# Patient Record
Sex: Female | Born: 1980 | Race: Black or African American | Hispanic: No | Smoking: Never smoker
Health system: Southern US, Community
[De-identification: ages and names within clinical notes are randomized; demographics above are authoritative.]

## PROBLEM LIST (undated history)

## (undated) ENCOUNTER — Inpatient Hospital Stay (HOSPITAL_COMMUNITY): Payer: Self-pay

## (undated) DIAGNOSIS — F419 Anxiety disorder, unspecified: Secondary | ICD-10-CM

## (undated) DIAGNOSIS — D649 Anemia, unspecified: Secondary | ICD-10-CM

## (undated) DIAGNOSIS — N390 Urinary tract infection, site not specified: Secondary | ICD-10-CM

## (undated) DIAGNOSIS — N83209 Unspecified ovarian cyst, unspecified side: Secondary | ICD-10-CM

## (undated) DIAGNOSIS — A549 Gonococcal infection, unspecified: Secondary | ICD-10-CM

## (undated) DIAGNOSIS — IMO0002 Reserved for concepts with insufficient information to code with codable children: Secondary | ICD-10-CM

## (undated) DIAGNOSIS — R87619 Unspecified abnormal cytological findings in specimens from cervix uteri: Secondary | ICD-10-CM

## (undated) HISTORY — PX: OTHER SURGICAL HISTORY: SHX169

## (undated) HISTORY — PX: LEEP: SHX91

## (undated) HISTORY — PX: WISDOM TOOTH EXTRACTION: SHX21

## (undated) HISTORY — DX: Anxiety disorder, unspecified: F41.9

## (undated) HISTORY — PX: CHOLECYSTECTOMY: SHX55

## (undated) HISTORY — PX: DILATION AND CURETTAGE OF UTERUS: SHX78

---

## 2002-01-16 ENCOUNTER — Encounter: Payer: Self-pay | Admitting: Emergency Medicine

## 2002-01-16 ENCOUNTER — Emergency Department (HOSPITAL_COMMUNITY): Admission: EM | Admit: 2002-01-16 | Discharge: 2002-01-17 | Payer: Self-pay | Admitting: Emergency Medicine

## 2004-03-12 ENCOUNTER — Emergency Department (HOSPITAL_COMMUNITY): Admission: EM | Admit: 2004-03-12 | Discharge: 2004-03-13 | Payer: Self-pay | Admitting: *Deleted

## 2004-03-20 ENCOUNTER — Emergency Department (HOSPITAL_COMMUNITY): Admission: EM | Admit: 2004-03-20 | Discharge: 2004-03-20 | Payer: Self-pay | Admitting: Emergency Medicine

## 2004-09-29 ENCOUNTER — Emergency Department (HOSPITAL_COMMUNITY): Admission: EM | Admit: 2004-09-29 | Discharge: 2004-09-30 | Payer: Self-pay | Admitting: *Deleted

## 2004-12-04 ENCOUNTER — Emergency Department (HOSPITAL_COMMUNITY): Admission: EM | Admit: 2004-12-04 | Discharge: 2004-12-04 | Payer: Self-pay | Admitting: Emergency Medicine

## 2005-06-01 ENCOUNTER — Emergency Department (HOSPITAL_COMMUNITY): Admission: EM | Admit: 2005-06-01 | Discharge: 2005-06-01 | Payer: Self-pay | Admitting: Emergency Medicine

## 2005-06-02 ENCOUNTER — Encounter: Admission: RE | Admit: 2005-06-02 | Discharge: 2005-06-02 | Payer: Self-pay | Admitting: *Deleted

## 2005-06-23 ENCOUNTER — Encounter: Admission: RE | Admit: 2005-06-23 | Discharge: 2005-06-23 | Payer: Self-pay | Admitting: *Deleted

## 2005-10-01 ENCOUNTER — Emergency Department (HOSPITAL_COMMUNITY): Admission: EM | Admit: 2005-10-01 | Discharge: 2005-10-01 | Payer: Self-pay | Admitting: Emergency Medicine

## 2005-10-05 DIAGNOSIS — A549 Gonococcal infection, unspecified: Secondary | ICD-10-CM

## 2005-10-05 HISTORY — DX: Gonococcal infection, unspecified: A54.9

## 2006-03-03 ENCOUNTER — Emergency Department (HOSPITAL_COMMUNITY): Admission: EM | Admit: 2006-03-03 | Discharge: 2006-03-03 | Payer: Self-pay | Admitting: Emergency Medicine

## 2006-06-08 ENCOUNTER — Emergency Department (HOSPITAL_COMMUNITY): Admission: EM | Admit: 2006-06-08 | Discharge: 2006-06-08 | Payer: Self-pay | Admitting: Emergency Medicine

## 2006-10-14 ENCOUNTER — Emergency Department (HOSPITAL_COMMUNITY): Admission: EM | Admit: 2006-10-14 | Discharge: 2006-10-14 | Payer: Self-pay | Admitting: Emergency Medicine

## 2007-05-08 ENCOUNTER — Emergency Department (HOSPITAL_COMMUNITY): Admission: EM | Admit: 2007-05-08 | Discharge: 2007-05-08 | Payer: Self-pay | Admitting: Emergency Medicine

## 2007-07-16 ENCOUNTER — Emergency Department (HOSPITAL_COMMUNITY): Admission: EM | Admit: 2007-07-16 | Discharge: 2007-07-16 | Payer: Self-pay | Admitting: Emergency Medicine

## 2008-02-27 ENCOUNTER — Inpatient Hospital Stay (HOSPITAL_COMMUNITY): Admission: AD | Admit: 2008-02-27 | Discharge: 2008-02-27 | Payer: Self-pay | Admitting: Obstetrics & Gynecology

## 2008-03-01 ENCOUNTER — Inpatient Hospital Stay (HOSPITAL_COMMUNITY): Admission: AD | Admit: 2008-03-01 | Discharge: 2008-03-01 | Payer: Self-pay | Admitting: Obstetrics & Gynecology

## 2008-03-08 ENCOUNTER — Inpatient Hospital Stay (HOSPITAL_COMMUNITY): Admission: RE | Admit: 2008-03-08 | Discharge: 2008-03-08 | Payer: Self-pay | Admitting: Obstetrics & Gynecology

## 2008-08-15 ENCOUNTER — Emergency Department (HOSPITAL_COMMUNITY): Admission: EM | Admit: 2008-08-15 | Discharge: 2008-08-15 | Payer: Self-pay | Admitting: Emergency Medicine

## 2008-08-31 ENCOUNTER — Emergency Department (HOSPITAL_COMMUNITY): Admission: EM | Admit: 2008-08-31 | Discharge: 2008-08-31 | Payer: Self-pay | Admitting: Emergency Medicine

## 2008-12-24 ENCOUNTER — Emergency Department (HOSPITAL_COMMUNITY): Admission: EM | Admit: 2008-12-24 | Discharge: 2008-12-24 | Payer: Self-pay | Admitting: Emergency Medicine

## 2009-01-05 ENCOUNTER — Emergency Department (HOSPITAL_COMMUNITY): Admission: EM | Admit: 2009-01-05 | Discharge: 2009-01-05 | Payer: Self-pay | Admitting: Emergency Medicine

## 2009-01-30 ENCOUNTER — Inpatient Hospital Stay (HOSPITAL_COMMUNITY): Admission: AD | Admit: 2009-01-30 | Discharge: 2009-01-30 | Payer: Self-pay | Admitting: Obstetrics & Gynecology

## 2009-05-02 ENCOUNTER — Inpatient Hospital Stay (HOSPITAL_COMMUNITY): Admission: AD | Admit: 2009-05-02 | Discharge: 2009-05-02 | Payer: Self-pay | Admitting: Obstetrics and Gynecology

## 2009-05-17 ENCOUNTER — Inpatient Hospital Stay (HOSPITAL_COMMUNITY): Admission: AD | Admit: 2009-05-17 | Discharge: 2009-05-17 | Payer: Self-pay | Admitting: Obstetrics and Gynecology

## 2009-05-18 ENCOUNTER — Ambulatory Visit: Payer: Self-pay | Admitting: Physician Assistant

## 2009-05-18 ENCOUNTER — Inpatient Hospital Stay (HOSPITAL_COMMUNITY): Admission: AD | Admit: 2009-05-18 | Discharge: 2009-05-18 | Payer: Self-pay | Admitting: Obstetrics and Gynecology

## 2009-05-29 ENCOUNTER — Ambulatory Visit (HOSPITAL_COMMUNITY): Admission: RE | Admit: 2009-05-29 | Discharge: 2009-05-29 | Payer: Self-pay | Admitting: Obstetrics and Gynecology

## 2009-08-02 ENCOUNTER — Ambulatory Visit: Payer: Self-pay | Admitting: Family Medicine

## 2009-08-02 ENCOUNTER — Inpatient Hospital Stay (HOSPITAL_COMMUNITY): Admission: AD | Admit: 2009-08-02 | Discharge: 2009-08-02 | Payer: Self-pay | Admitting: Obstetrics and Gynecology

## 2009-08-18 ENCOUNTER — Inpatient Hospital Stay (HOSPITAL_COMMUNITY): Admission: AD | Admit: 2009-08-18 | Discharge: 2009-08-18 | Payer: Self-pay | Admitting: Obstetrics and Gynecology

## 2009-08-20 ENCOUNTER — Ambulatory Visit (HOSPITAL_COMMUNITY): Admission: RE | Admit: 2009-08-20 | Discharge: 2009-08-20 | Payer: Self-pay | Admitting: Obstetrics and Gynecology

## 2009-08-23 ENCOUNTER — Ambulatory Visit (HOSPITAL_COMMUNITY): Admission: RE | Admit: 2009-08-23 | Discharge: 2009-08-23 | Payer: Self-pay | Admitting: Obstetrics and Gynecology

## 2009-08-26 ENCOUNTER — Inpatient Hospital Stay (HOSPITAL_COMMUNITY): Admission: AD | Admit: 2009-08-26 | Discharge: 2009-08-26 | Payer: Self-pay | Admitting: Obstetrics and Gynecology

## 2009-08-26 ENCOUNTER — Encounter: Payer: Self-pay | Admitting: Obstetrics and Gynecology

## 2009-09-02 ENCOUNTER — Ambulatory Visit (HOSPITAL_COMMUNITY): Admission: RE | Admit: 2009-09-02 | Discharge: 2009-09-02 | Payer: Self-pay | Admitting: Obstetrics and Gynecology

## 2009-09-04 ENCOUNTER — Ambulatory Visit (HOSPITAL_COMMUNITY): Admission: RE | Admit: 2009-09-04 | Discharge: 2009-09-04 | Payer: Self-pay | Admitting: Obstetrics and Gynecology

## 2009-09-05 ENCOUNTER — Inpatient Hospital Stay (HOSPITAL_COMMUNITY): Admission: RE | Admit: 2009-09-05 | Discharge: 2009-09-08 | Payer: Self-pay | Admitting: Obstetrics and Gynecology

## 2009-09-05 ENCOUNTER — Encounter (INDEPENDENT_AMBULATORY_CARE_PROVIDER_SITE_OTHER): Payer: Self-pay | Admitting: Obstetrics and Gynecology

## 2009-11-25 ENCOUNTER — Emergency Department (HOSPITAL_COMMUNITY): Admission: EM | Admit: 2009-11-25 | Discharge: 2009-11-25 | Payer: Self-pay | Admitting: Emergency Medicine

## 2010-04-29 ENCOUNTER — Emergency Department (HOSPITAL_COMMUNITY): Admission: EM | Admit: 2010-04-29 | Discharge: 2010-04-29 | Payer: Self-pay | Admitting: Emergency Medicine

## 2010-06-17 DIAGNOSIS — E785 Hyperlipidemia, unspecified: Secondary | ICD-10-CM | POA: Insufficient documentation

## 2010-06-17 DIAGNOSIS — J45909 Unspecified asthma, uncomplicated: Secondary | ICD-10-CM | POA: Insufficient documentation

## 2010-10-05 DIAGNOSIS — F419 Anxiety disorder, unspecified: Secondary | ICD-10-CM

## 2010-10-05 HISTORY — DX: Anxiety disorder, unspecified: F41.9

## 2010-10-24 IMAGING — US US OB DETAIL EACH ADDL GEST + 14 WK
1 series · 14 of 28 positions shown · non-contrast
Comparison: none

OBSTETRICAL ULTRASOUND:
 This ultrasound exam was performed in the [HOSPITAL] Ultrasound Department.  The OB US report was generated in the AS system, and faxed to the ordering physician.  This report is also available in [REDACTED] PACS.

[Series 1: us ob detail add'left gest + 14 wk · 0.25mm/px · 93 acquisitions, 14 frames shown]
[im 4/93]
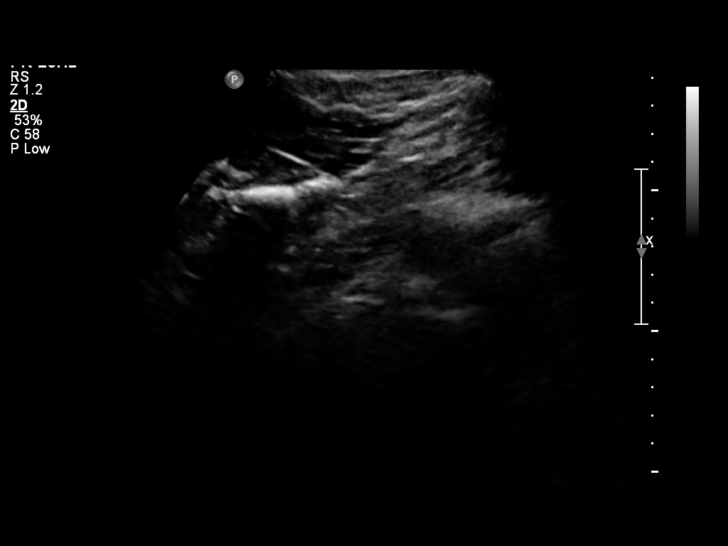
[im 11/93]
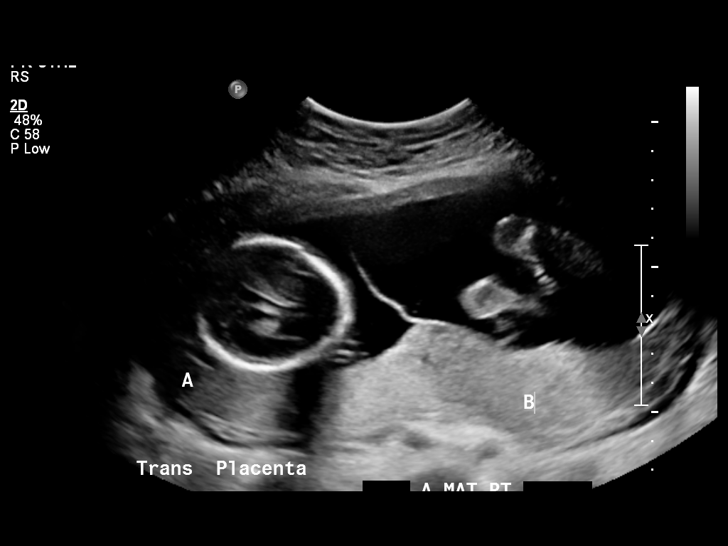
[im 18/93]
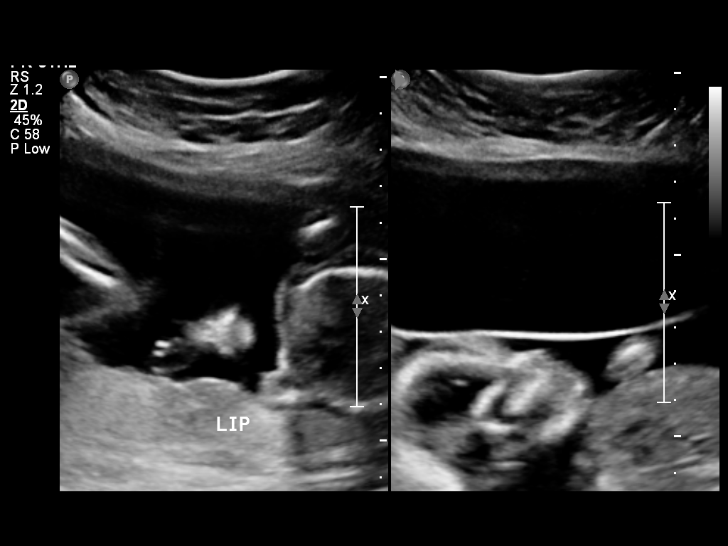
[im 24/93]
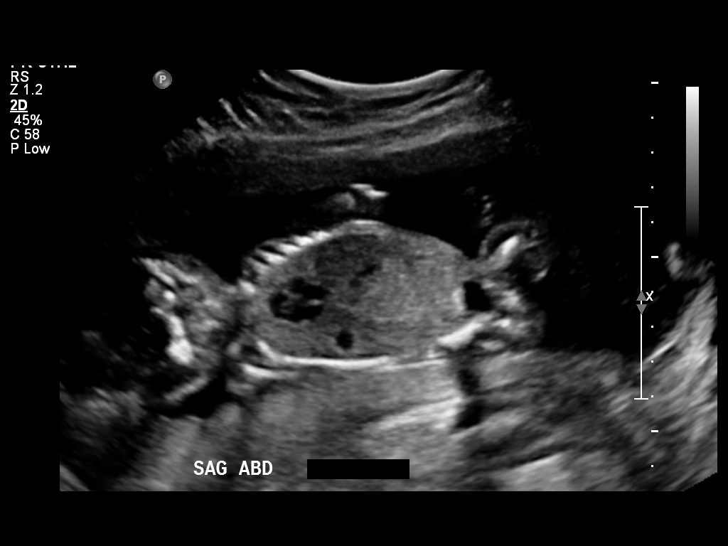
[im 31/93]
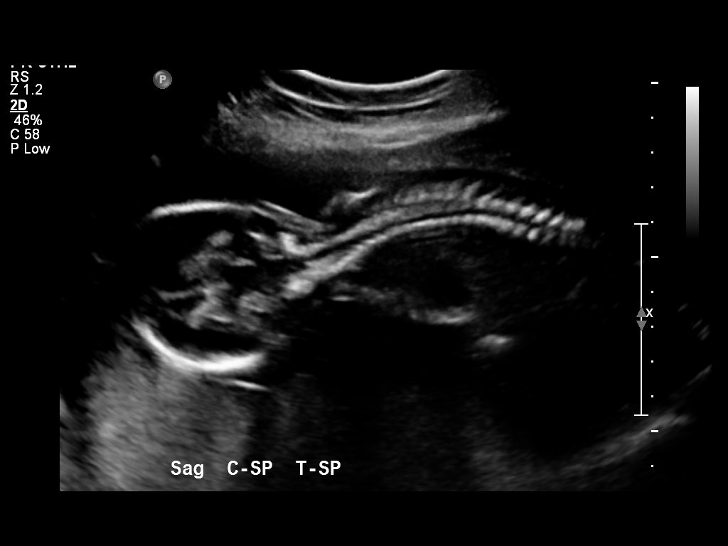
[im 38/93]
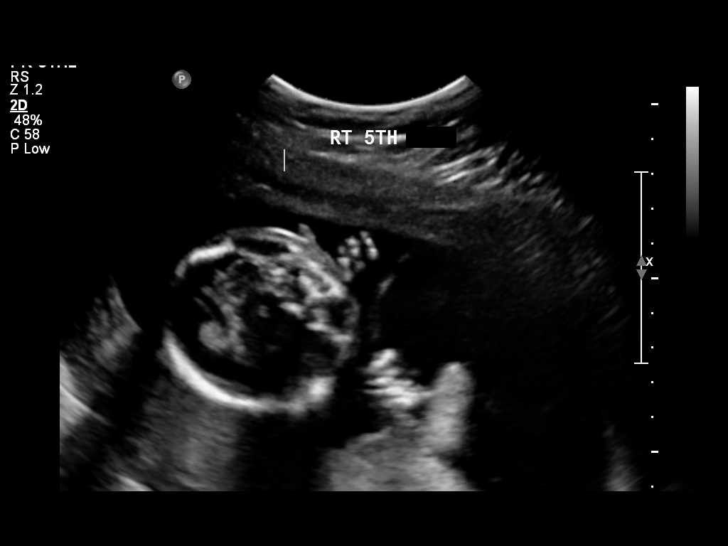
[im 45/93]
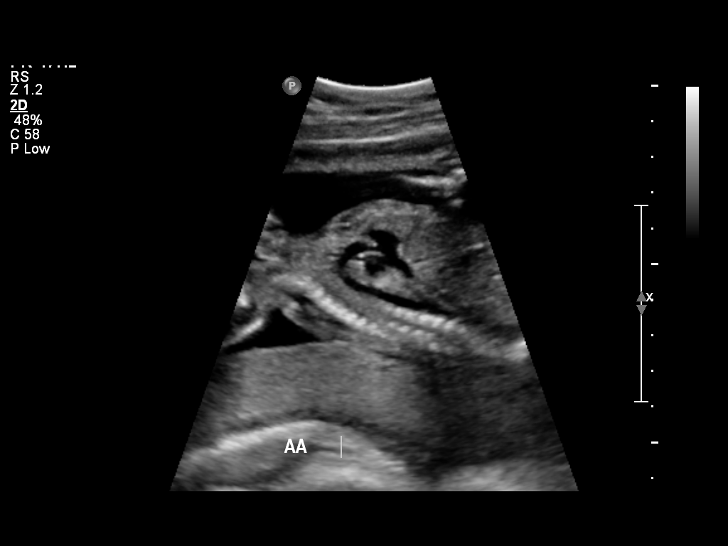
[im 52/93]
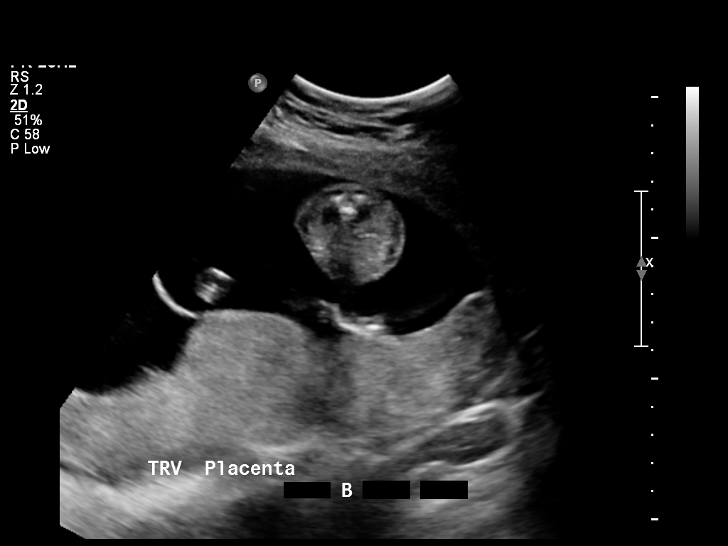
[im 58/93]
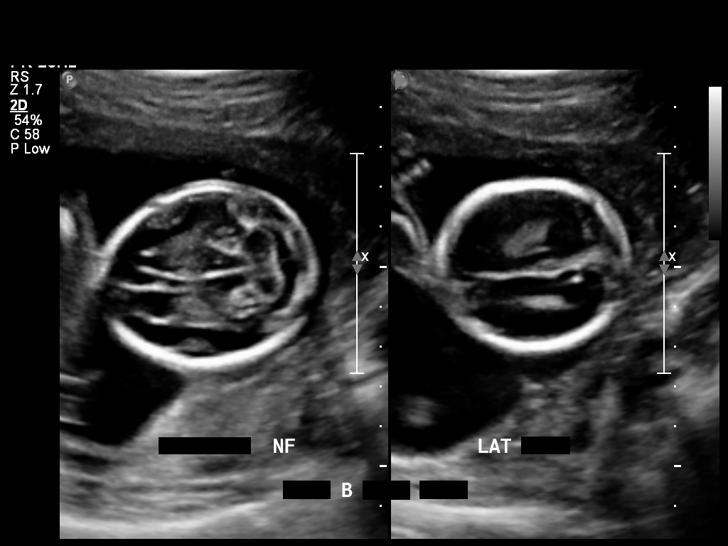
[im 65/93]
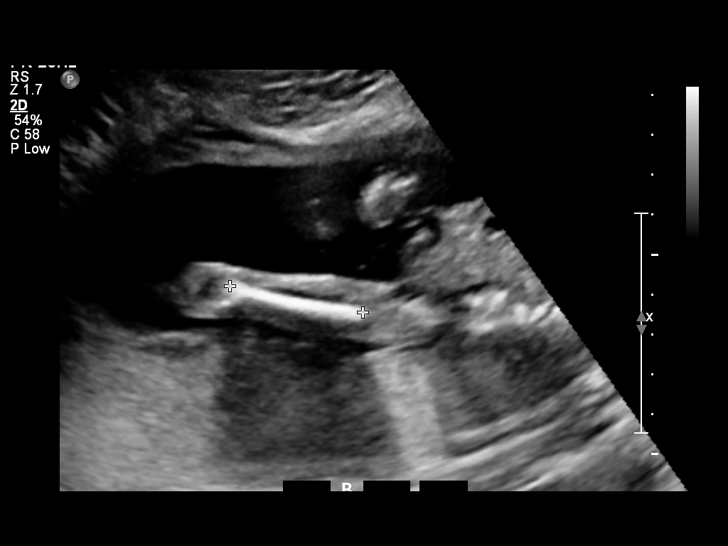
[im 72/93]
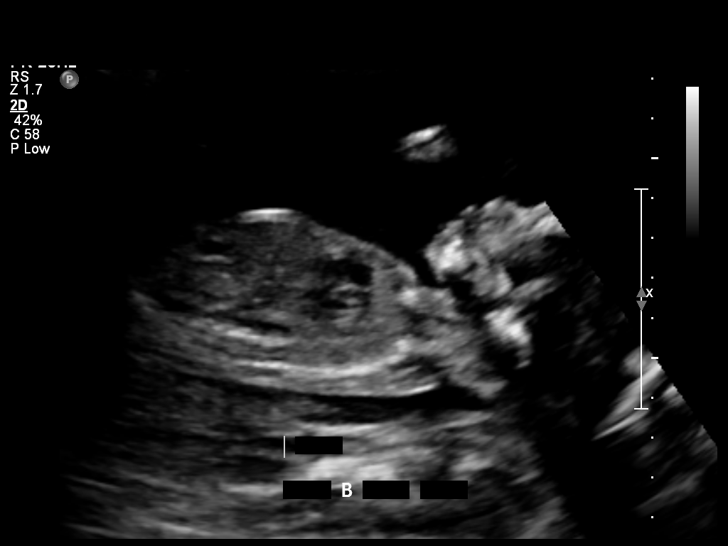
[im 79/93]
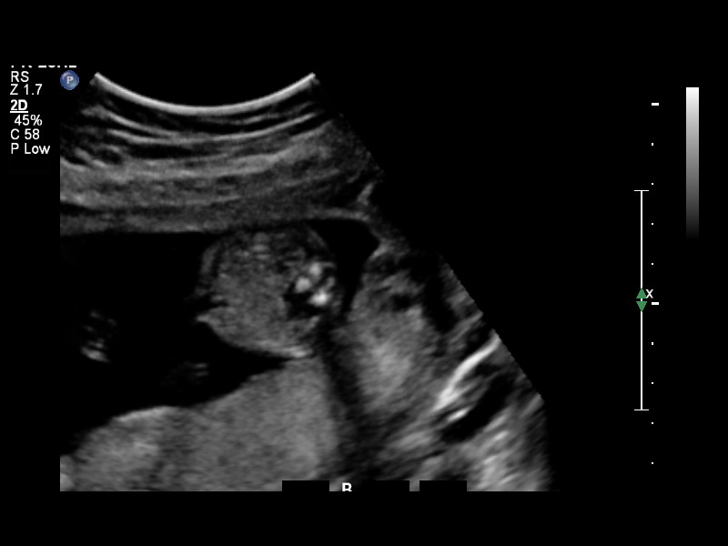
[im 86/93]
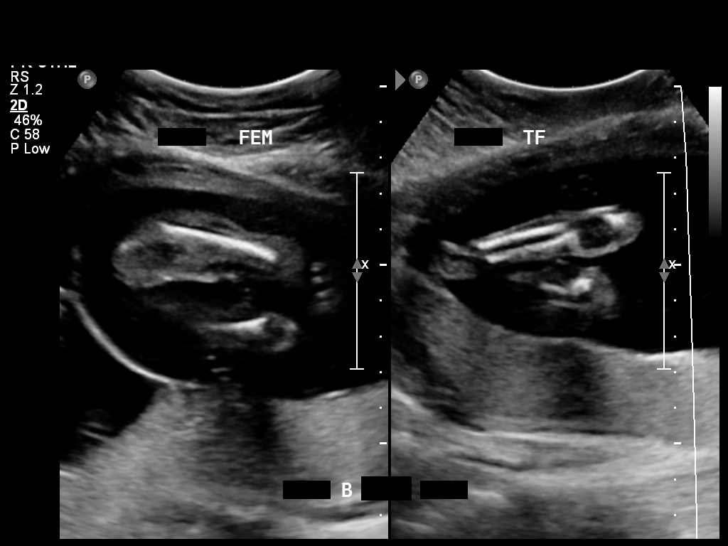
[im 93/93]
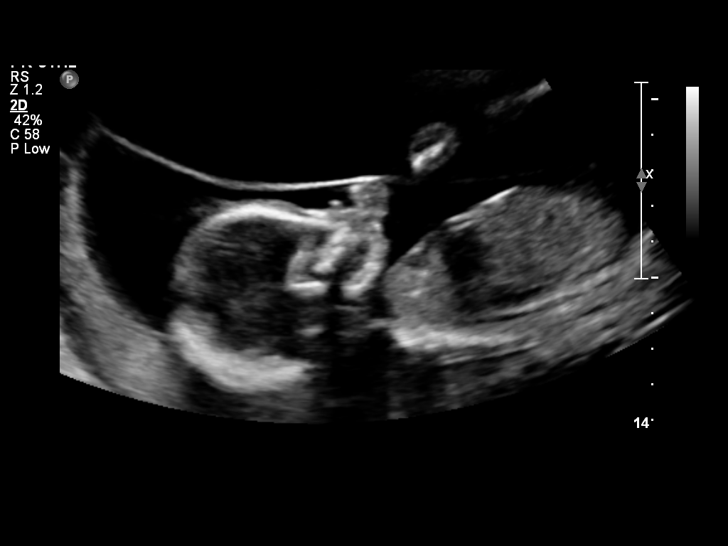

[14 of 28 positions shown; findings below may reference images not displayed]

IMPRESSION: See AS Obstetric US report.

## 2010-10-26 ENCOUNTER — Encounter: Payer: Self-pay | Admitting: Obstetrics and Gynecology

## 2010-11-20 IMAGING — CR DG CHEST 2V
2 series · 2 of 2 positions shown · non-contrast
Comparison: 08/31/2008

CLINICAL DATA: Right-sided chest pain.

CHEST - 2 VIEW

[view not recorded (1 of 2)]
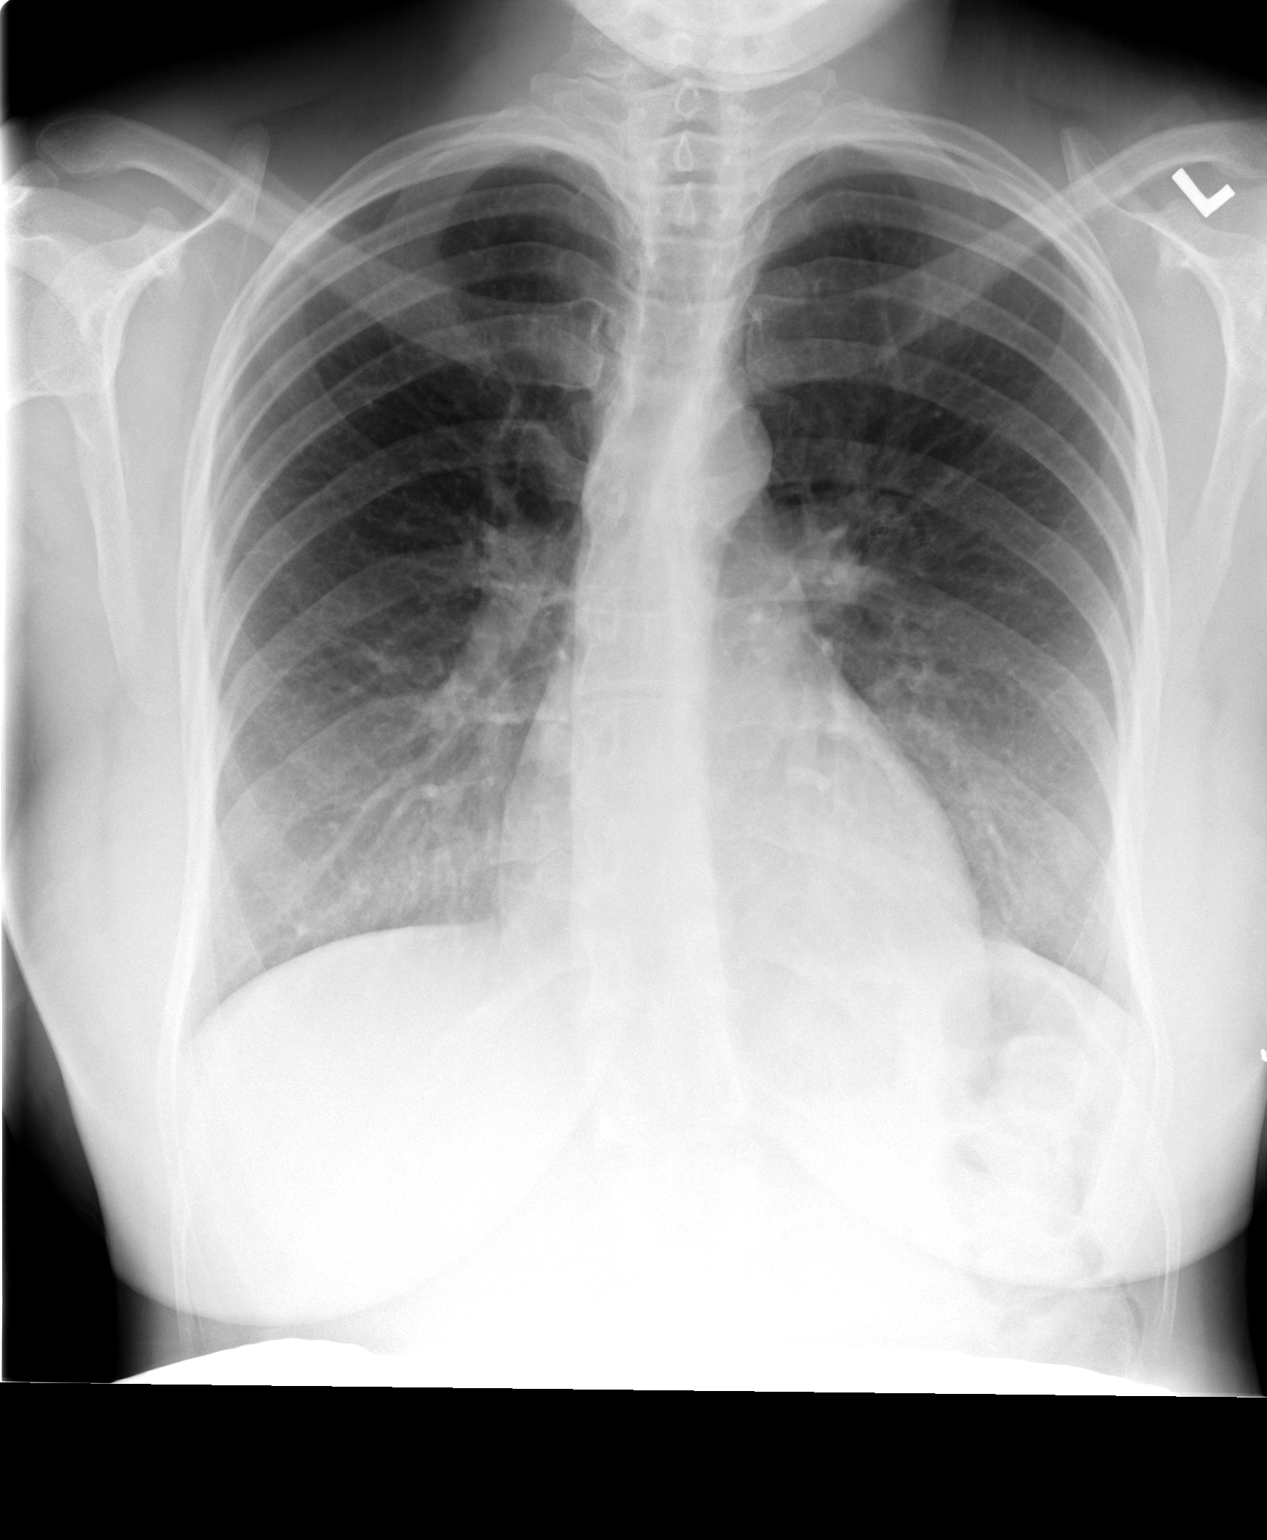

[view not recorded (2 of 2)]
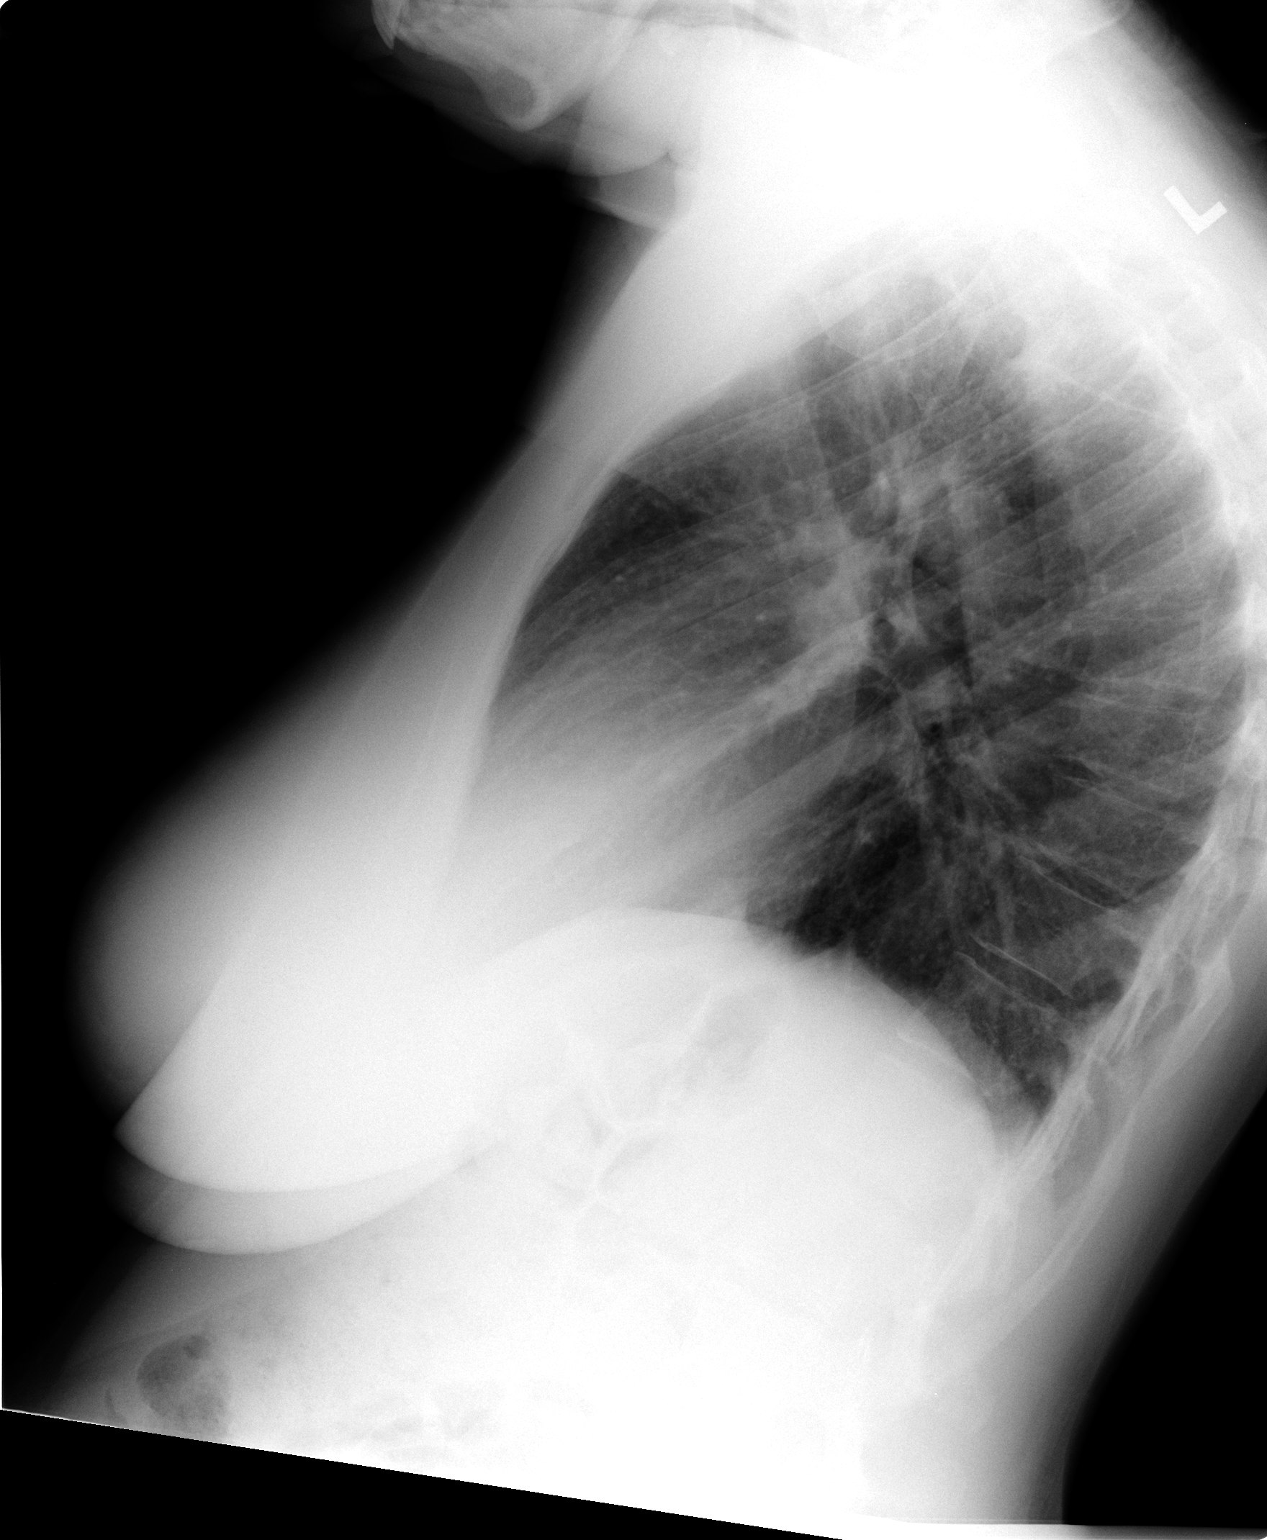

[2 of 2 positions shown; findings below may reference images not displayed]

FINDINGS: The heart size and mediastinal contours are within
normal limits.  Both lungs are clear.  The visualized skeletal
structures are unremarkable.
IMPRESSION: No active cardiopulmonary disease.

## 2010-12-20 LAB — POCT I-STAT, CHEM 8
BUN: 7 mg/dL (ref 6–23)
Creatinine, Ser: 0.7 mg/dL (ref 0.4–1.2)
Potassium: 3.8 mEq/L (ref 3.5–5.1)
Sodium: 141 mEq/L (ref 135–145)

## 2010-12-20 LAB — POCT CARDIAC MARKERS
CKMB, poc: <1 ng/mL — ABNORMAL LOW (ref 1.0–8.0)
Myoglobin, poc: 45.4 ng/mL (ref 12–200)
Troponin i, poc: <0.05 ng/mL (ref 0.00–0.09)

## 2010-12-20 LAB — URINE CULTURE: Culture  Setup Time: 201107261707

## 2010-12-20 LAB — URINE MICROSCOPIC-ADD ON

## 2010-12-20 LAB — URINALYSIS, ROUTINE W REFLEX MICROSCOPIC
Bilirubin Urine: NEGATIVE
Hgb urine dipstick: NEGATIVE
Ketones, ur: NEGATIVE mg/dL
Specific Gravity, Urine: 1.021 (ref 1.005–1.030)
Urobilinogen, UA: 0.2 mg/dL (ref 0.0–1.0)
pH: 7 (ref 5.0–8.0)

## 2010-12-24 ENCOUNTER — Inpatient Hospital Stay (HOSPITAL_COMMUNITY): Payer: Managed Care, Other (non HMO)

## 2010-12-24 ENCOUNTER — Inpatient Hospital Stay (HOSPITAL_COMMUNITY)
Admission: EM | Admit: 2010-12-24 | Discharge: 2010-12-24 | Disposition: A | Discharge status: Home / Self Care | Payer: Managed Care, Other (non HMO) | Source: Ambulatory Visit | Attending: Obstetrics and Gynecology | Admitting: Obstetrics and Gynecology

## 2010-12-24 DIAGNOSIS — M549 Dorsalgia, unspecified: Secondary | ICD-10-CM

## 2010-12-24 DIAGNOSIS — O99891 Other specified diseases and conditions complicating pregnancy: Secondary | ICD-10-CM

## 2010-12-24 DIAGNOSIS — O9989 Other specified diseases and conditions complicating pregnancy, childbirth and the puerperium: Secondary | ICD-10-CM

## 2010-12-24 DIAGNOSIS — O210 Mild hyperemesis gravidarum: Secondary | ICD-10-CM

## 2010-12-24 LAB — COMPREHENSIVE METABOLIC PANEL
ALT: 19 U/L (ref 0–35)
Calcium: 8.5 mg/dL (ref 8.4–10.5)
GFR calc Af Amer: >60 mL/min (ref >60)
Glucose, Bld: 94 mg/dL (ref 70–99)
Sodium: 136 mEq/L (ref 135–145)
Total Protein: 6.1 g/dL (ref 6.0–8.3)

## 2010-12-24 LAB — URINALYSIS, ROUTINE W REFLEX MICROSCOPIC
Ketones, ur: NEGATIVE mg/dL
Ketones, ur: NEGATIVE mg/dL
Nitrite: NEGATIVE
Nitrite: NEGATIVE
Protein, ur: NEGATIVE mg/dL
Protein, ur: NEGATIVE mg/dL
Urobilinogen, UA: 1 mg/dL (ref 0.0–1.0)
pH: 5.5 (ref 5.0–8.0)

## 2010-12-24 LAB — CBC
HCT: 33.6 % — ABNORMAL LOW (ref 36.0–46.0)
MCHC: 32.7 g/dL (ref 30.0–36.0)
Platelets: 204 10*3/uL (ref 150–400)
RDW: 12.8 % (ref 11.5–15.5)

## 2010-12-24 LAB — POCT I-STAT, CHEM 8
BUN: 9 mg/dL (ref 6–23)
Calcium, Ion: 1.16 mmol/L (ref 1.12–1.32)
Chloride: 108 mEq/L (ref 96–112)
Creatinine, Ser: 0.9 mg/dL (ref 0.4–1.2)
Glucose, Bld: 82 mg/dL (ref 70–99)

## 2010-12-24 LAB — POCT PREGNANCY, URINE: Preg Test, Ur: POSITIVE

## 2010-12-24 LAB — WET PREP, GENITAL: Yeast Wet Prep HPF POC: NONE SEEN

## 2010-12-24 LAB — GC/CHLAMYDIA PROBE AMP, GENITAL: Chlamydia, DNA Probe: NEGATIVE

## 2010-12-24 LAB — URINE CULTURE

## 2011-01-06 LAB — CBC
MCHC: 33.4 g/dL (ref 30.0–36.0)
MCV: 89.4 fL (ref 78.0–100.0)
Platelets: 224 10*3/uL (ref 150–400)
Platelets: 233 10*3/uL (ref 150–400)
RBC: 3.75 MIL/uL — ABNORMAL LOW (ref 3.87–5.11)
RDW: 13.3 % (ref 11.5–15.5)
WBC: 11.3 10*3/uL — ABNORMAL HIGH (ref 4.0–10.5)

## 2011-01-06 LAB — TYPE AND SCREEN: Antibody Screen: NEGATIVE

## 2011-01-06 LAB — RPR: RPR Ser Ql: NONREACTIVE

## 2011-01-07 LAB — URINALYSIS, ROUTINE W REFLEX MICROSCOPIC
Glucose, UA: 100 mg/dL — AB
Ketones, ur: 15 mg/dL — AB
Nitrite: NEGATIVE
Protein, ur: NEGATIVE mg/dL
Urobilinogen, UA: 2 mg/dL — ABNORMAL HIGH (ref 0.0–1.0)

## 2011-01-07 LAB — URINE MICROSCOPIC-ADD ON

## 2011-01-08 LAB — URINALYSIS, ROUTINE W REFLEX MICROSCOPIC
Glucose, UA: 100 mg/dL — AB
Hgb urine dipstick: NEGATIVE
Protein, ur: NEGATIVE mg/dL
Specific Gravity, Urine: 1.01 (ref 1.005–1.030)
Urobilinogen, UA: 1 mg/dL (ref 0.0–1.0)

## 2011-01-08 LAB — CBC
HCT: 31.9 % — ABNORMAL LOW (ref 36.0–46.0)
Hemoglobin: 10.9 g/dL — ABNORMAL LOW (ref 12.0–15.0)
MCHC: 34 g/dL (ref 30.0–36.0)
MCV: 90.7 fL (ref 78.0–100.0)
Platelets: 228 10*3/uL (ref 150–400)
RDW: 13 % (ref 11.5–15.5)

## 2011-01-08 LAB — COMPREHENSIVE METABOLIC PANEL
Albumin: 2.5 g/dL — ABNORMAL LOW (ref 3.5–5.2)
Alkaline Phosphatase: 150 U/L — ABNORMAL HIGH (ref 39–117)
BUN: 1 mg/dL — ABNORMAL LOW (ref 6–23)
Creatinine, Ser: 0.47 mg/dL (ref 0.4–1.2)
Glucose, Bld: 119 mg/dL — ABNORMAL HIGH (ref 70–99)
Potassium: 3.3 mEq/L — ABNORMAL LOW (ref 3.5–5.1)
Total Bilirubin: 0.6 mg/dL (ref 0.3–1.2)
Total Protein: 5.7 g/dL — ABNORMAL LOW (ref 6.0–8.3)

## 2011-01-11 LAB — COMPREHENSIVE METABOLIC PANEL
ALT: 25 U/L (ref 0–35)
AST: 23 U/L (ref 0–37)
Albumin: 3 g/dL — ABNORMAL LOW (ref 3.5–5.2)
Alkaline Phosphatase: 58 U/L (ref 39–117)
CO2: 22 mEq/L (ref 19–32)
Chloride: 106 mEq/L (ref 96–112)
GFR calc Af Amer: >60 mL/min (ref >60)
GFR calc non Af Amer: >60 mL/min (ref >60)
Potassium: 3.7 mEq/L (ref 3.5–5.1)
Total Bilirubin: 0.6 mg/dL (ref 0.3–1.2)

## 2011-01-11 LAB — CBC
Platelets: 212 10*3/uL (ref 150–400)
RBC: 3.7 MIL/uL — ABNORMAL LOW (ref 3.87–5.11)
WBC: 9.4 10*3/uL (ref 4.0–10.5)

## 2011-01-11 LAB — URINALYSIS, ROUTINE W REFLEX MICROSCOPIC
Bilirubin Urine: NEGATIVE
Hgb urine dipstick: NEGATIVE
Ketones, ur: NEGATIVE mg/dL
Protein, ur: NEGATIVE mg/dL
Urobilinogen, UA: 1 mg/dL (ref 0.0–1.0)

## 2011-01-14 LAB — URINALYSIS, ROUTINE W REFLEX MICROSCOPIC
Bilirubin Urine: NEGATIVE
Nitrite: NEGATIVE
Specific Gravity, Urine: 1.02 (ref 1.005–1.030)
pH: 7 (ref 5.0–8.0)

## 2011-01-14 LAB — COMPREHENSIVE METABOLIC PANEL
AST: 20 U/L (ref 0–37)
CO2: 25 mEq/L (ref 19–32)
Calcium: 9.2 mg/dL (ref 8.4–10.5)
Creatinine, Ser: 0.57 mg/dL (ref 0.4–1.2)
GFR calc Af Amer: >60 mL/min (ref >60)
GFR calc non Af Amer: >60 mL/min (ref >60)
Total Protein: 6.3 g/dL (ref 6.0–8.3)

## 2011-01-14 LAB — CBC
MCHC: 33.9 g/dL (ref 30.0–36.0)
MCV: 90.4 fL (ref 78.0–100.0)
Platelets: 263 10*3/uL (ref 150–400)
RBC: 4.31 MIL/uL (ref 3.87–5.11)
RDW: 12.5 % (ref 11.5–15.5)

## 2011-02-17 IMAGING — US US FETAL BPP W/O NONSTRESS
1 series · 14 of 14 positions shown · non-contrast
Comparison: none

OBSTETRICAL ULTRASOUND:
 This ultrasound was performed in The [HOSPITAL], and the AS OB/GYN report will be stored to [REDACTED] PACS.  This report is also available in [HOSPITAL]?s accessANYware.

[Series 1: us fetal bpp w/o nonstress · 14 acquisitions, 14 frames shown]
[im 1/14]
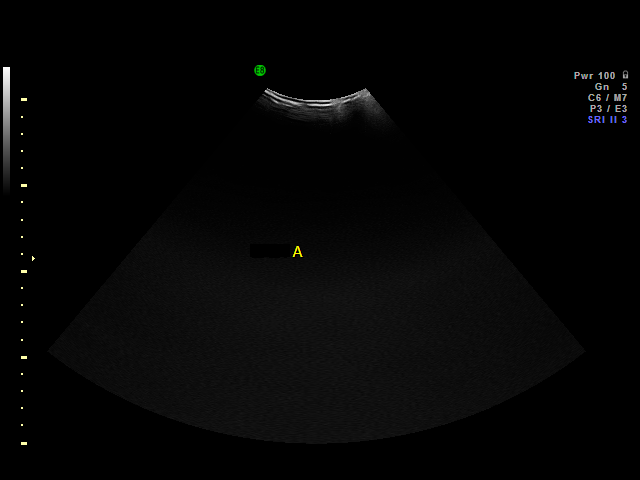
[im 2/14]
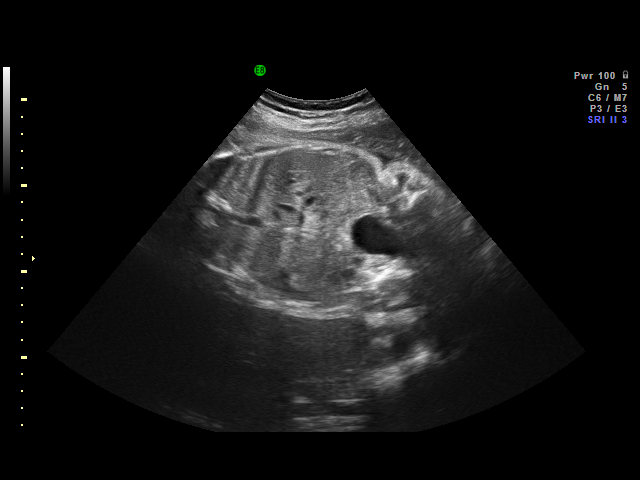
[im 3/14]
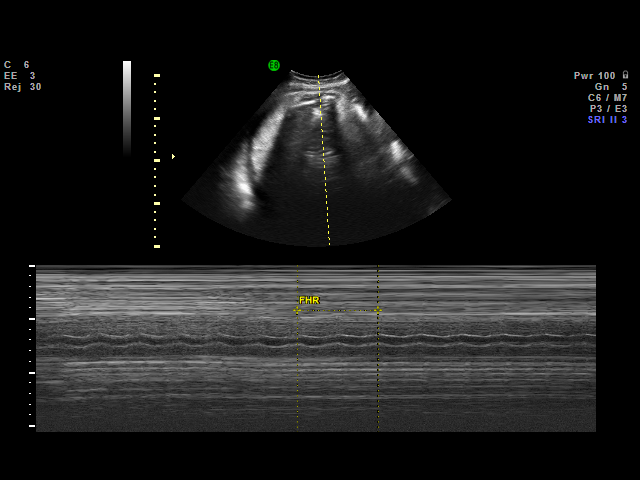
[im 4/14]
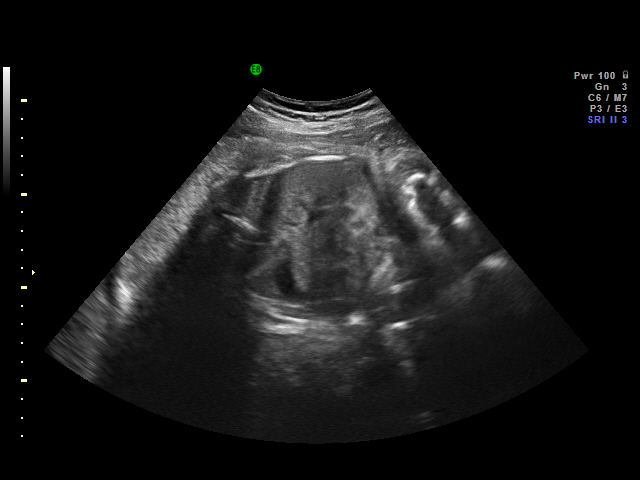
[im 5/14]
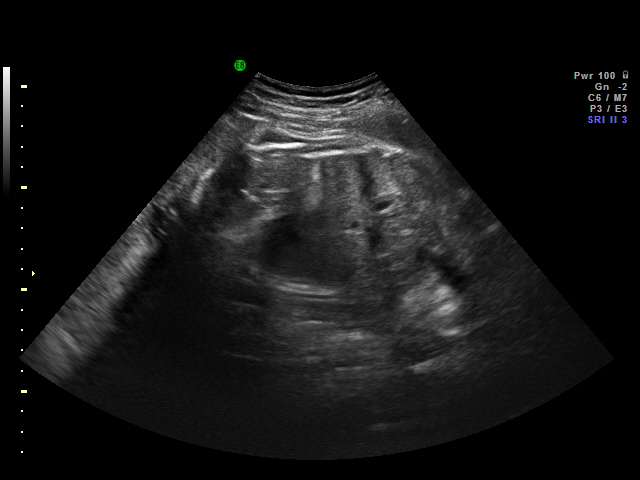
[im 6/14]
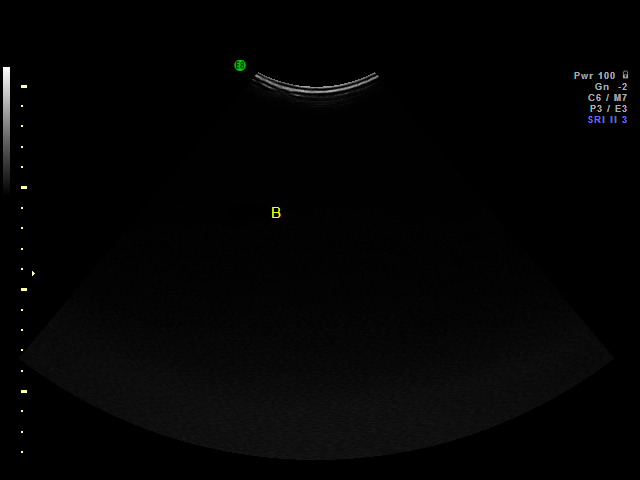
[im 7/14]
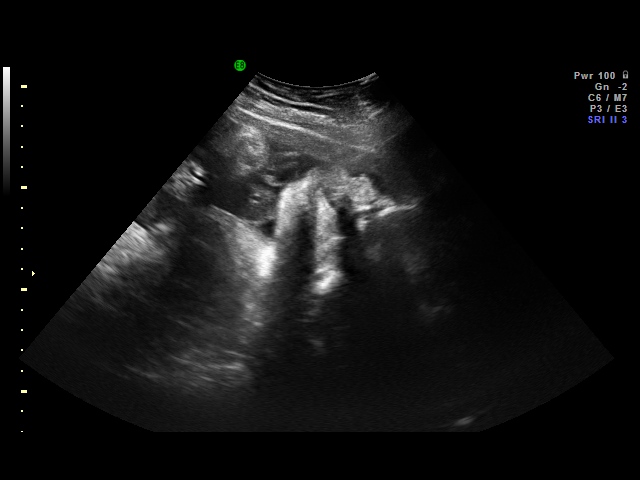
[im 8/14]
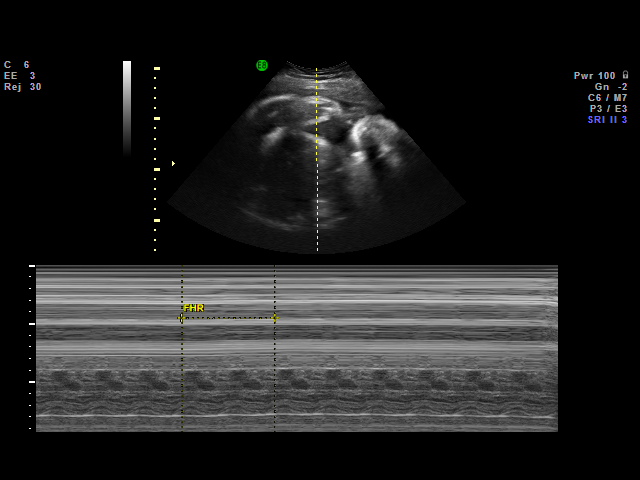
[im 9/14]
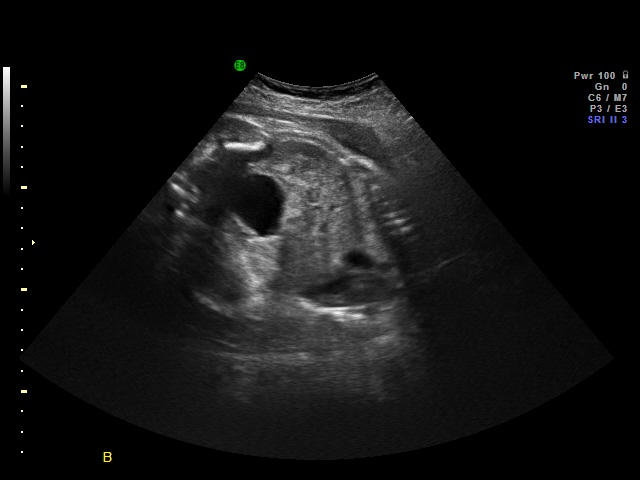
[im 10/14]
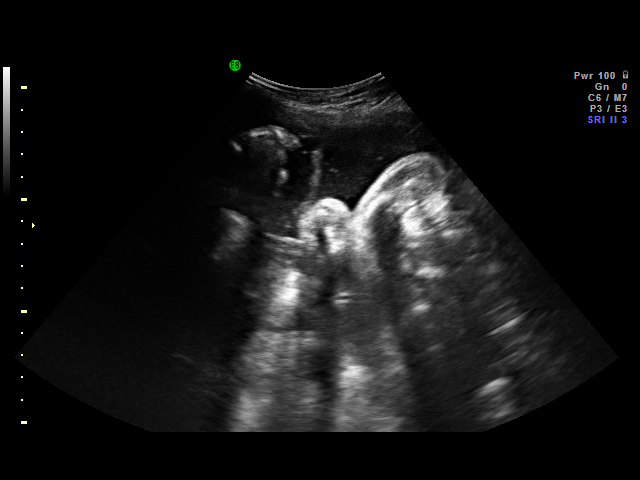
[im 11/14]
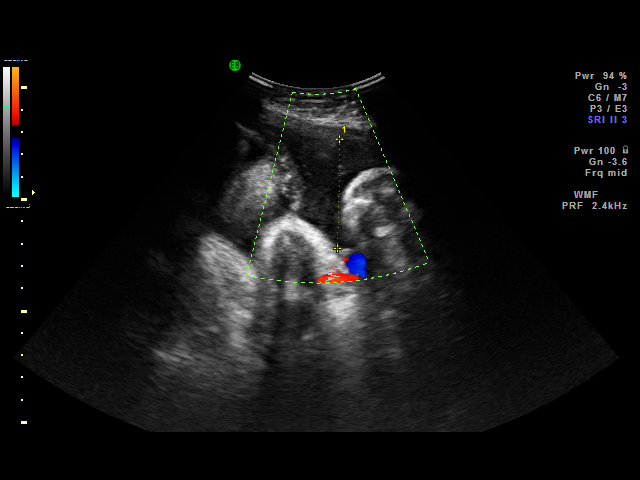
[im 12/14]
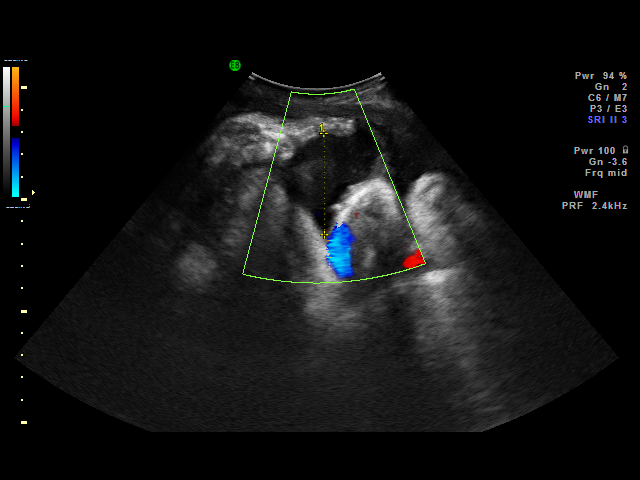
[im 13/14]
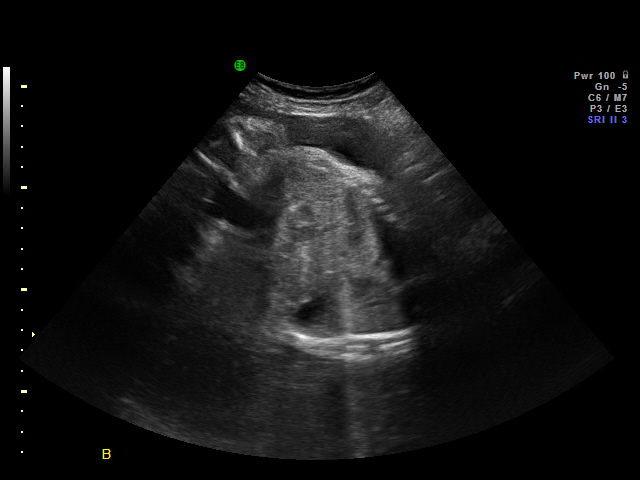
[im 14/14]
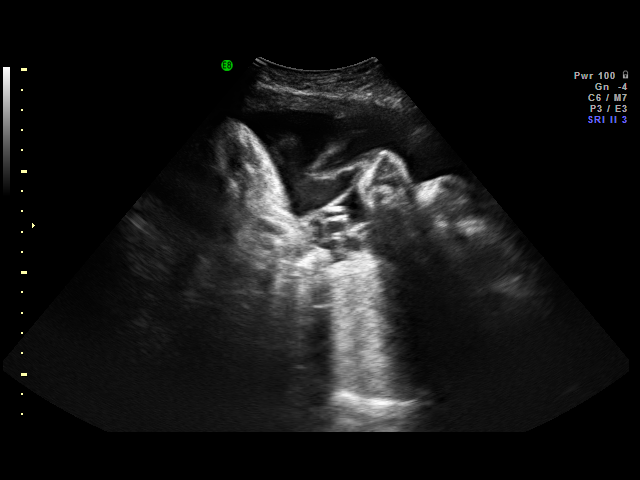

[14 of 14 positions shown; findings below may reference images not displayed]

IMPRESSION: AS OB/GYN has also been faxed to the ordering physician.

## 2011-03-14 ENCOUNTER — Inpatient Hospital Stay (HOSPITAL_COMMUNITY)
Admission: AD | Admit: 2011-03-14 | Discharge: 2011-03-14 | Disposition: A | Discharge status: Home / Self Care | Payer: Managed Care, Other (non HMO) | Source: Ambulatory Visit | Attending: Emergency Medicine | Admitting: Emergency Medicine

## 2011-03-14 DIAGNOSIS — R109 Unspecified abdominal pain: Secondary | ICD-10-CM | POA: Insufficient documentation

## 2011-03-14 DIAGNOSIS — O99891 Other specified diseases and conditions complicating pregnancy: Secondary | ICD-10-CM | POA: Insufficient documentation

## 2011-03-14 LAB — URINALYSIS, ROUTINE W REFLEX MICROSCOPIC
Bilirubin Urine: NEGATIVE
Glucose, UA: NEGATIVE mg/dL
Hgb urine dipstick: NEGATIVE
Ketones, ur: NEGATIVE mg/dL
Leukocytes, UA: NEGATIVE
Nitrite: NEGATIVE
Protein, ur: NEGATIVE mg/dL
Specific Gravity, Urine: 1.025 (ref 1.005–1.030)
Urobilinogen, UA: 1 mg/dL (ref 0.0–1.0)
pH: 7 (ref 5.0–8.0)

## 2011-04-02 ENCOUNTER — Inpatient Hospital Stay (HOSPITAL_COMMUNITY)
Admission: AD | Admit: 2011-04-02 | Discharge: 2011-04-02 | Disposition: A | Discharge status: Home / Self Care | Payer: Managed Care, Other (non HMO) | Source: Ambulatory Visit | Attending: Obstetrics & Gynecology | Admitting: Obstetrics & Gynecology

## 2011-04-02 DIAGNOSIS — O2650 Maternal hypotension syndrome, unspecified trimester: Secondary | ICD-10-CM

## 2011-04-02 LAB — URINALYSIS, ROUTINE W REFLEX MICROSCOPIC
Ketones, ur: NEGATIVE mg/dL
Leukocytes, UA: NEGATIVE
Nitrite: NEGATIVE
Protein, ur: NEGATIVE mg/dL

## 2011-04-02 LAB — COMPREHENSIVE METABOLIC PANEL
ALT: 9 U/L (ref 0–35)
Albumin: 2.5 g/dL — ABNORMAL LOW (ref 3.5–5.2)
Alkaline Phosphatase: 68 U/L (ref 39–117)
BUN: 3 mg/dL — ABNORMAL LOW (ref 6–23)
Potassium: 3.8 mEq/L (ref 3.5–5.1)
Sodium: 136 mEq/L (ref 135–145)
Total Protein: 6 g/dL (ref 6.0–8.3)

## 2011-04-02 LAB — CBC
MCHC: 33 g/dL (ref 30.0–36.0)
RDW: 13.4 % (ref 11.5–15.5)

## 2011-05-08 ENCOUNTER — Encounter (HOSPITAL_COMMUNITY): Payer: Self-pay | Admitting: *Deleted

## 2011-05-08 ENCOUNTER — Inpatient Hospital Stay (HOSPITAL_COMMUNITY)
Admission: AD | Admit: 2011-05-08 | Discharge: 2011-05-08 | Disposition: A | Discharge status: Home / Self Care | Payer: Managed Care, Other (non HMO) | Source: Ambulatory Visit | Attending: Obstetrics and Gynecology | Admitting: Obstetrics and Gynecology

## 2011-05-08 DIAGNOSIS — O99891 Other specified diseases and conditions complicating pregnancy: Secondary | ICD-10-CM | POA: Insufficient documentation

## 2011-05-08 DIAGNOSIS — R109 Unspecified abdominal pain: Secondary | ICD-10-CM | POA: Insufficient documentation

## 2011-05-08 HISTORY — DX: Unspecified ovarian cyst, unspecified side: N83.209

## 2011-05-08 HISTORY — DX: Gonococcal infection, unspecified: A54.9

## 2011-05-08 HISTORY — DX: Unspecified abnormal cytological findings in specimens from cervix uteri: R87.619

## 2011-05-08 HISTORY — DX: Reserved for concepts with insufficient information to code with codable children: IMO0002

## 2011-05-08 HISTORY — DX: Urinary tract infection, site not specified: N39.0

## 2011-05-08 LAB — URINALYSIS, ROUTINE W REFLEX MICROSCOPIC
Glucose, UA: NEGATIVE mg/dL
Ketones, ur: NEGATIVE mg/dL
Leukocytes, UA: NEGATIVE
Specific Gravity, Urine: 1.015 (ref 1.005–1.030)
pH: 7.5 (ref 5.0–8.0)

## 2011-05-08 MED ORDER — IBUPROFEN 600 MG PO TABS
600.0000 mg | ORAL_TABLET | Freq: Once | ORAL | Status: AC
  Administered 2011-05-08: 600 mg via ORAL
  Filled 2011-05-08: qty 1

## 2011-05-08 NOTE — Progress Notes (Signed)
Hadn't eaten, was at work, has been dizzy.  Went to get something to eat and had sudden onset of sharp lower abd pain/pressure

## 2011-05-08 NOTE — ED Provider Notes (Signed)
Pt states that she developed acute pain after moving at work.  Came in via EMS. After arrival pain significantly decreased.  Good FM. No vaginal bleeding.  Pain is in suprapubic area.  Pt had a history of a c section with last preg. FHTs are reactive.  Abdominal exam is normal.  Will discharge to home and follow up in office.

## 2011-05-12 ENCOUNTER — Other Ambulatory Visit (HOSPITAL_COMMUNITY): Payer: Self-pay | Admitting: Obstetrics and Gynecology

## 2011-05-12 ENCOUNTER — Ambulatory Visit (HOSPITAL_COMMUNITY)
Admission: RE | Admit: 2011-05-12 | Discharge: 2011-05-12 | Disposition: A | Discharge status: Home / Self Care | Payer: Managed Care, Other (non HMO) | Source: Ambulatory Visit | Attending: Obstetrics and Gynecology | Admitting: Obstetrics and Gynecology

## 2011-05-12 DIAGNOSIS — O99891 Other specified diseases and conditions complicating pregnancy: Secondary | ICD-10-CM

## 2011-05-12 DIAGNOSIS — Z8751 Personal history of pre-term labor: Secondary | ICD-10-CM | POA: Insufficient documentation

## 2011-05-12 DIAGNOSIS — Z3689 Encounter for other specified antenatal screening: Secondary | ICD-10-CM

## 2011-05-12 DIAGNOSIS — O34219 Maternal care for unspecified type scar from previous cesarean delivery: Secondary | ICD-10-CM | POA: Insufficient documentation

## 2011-05-12 DIAGNOSIS — M549 Dorsalgia, unspecified: Secondary | ICD-10-CM

## 2011-05-12 DIAGNOSIS — O09299 Supervision of pregnancy with other poor reproductive or obstetric history, unspecified trimester: Secondary | ICD-10-CM | POA: Insufficient documentation

## 2011-07-01 LAB — WET PREP, GENITAL: Yeast Wet Prep HPF POC: NONE SEEN

## 2011-07-01 LAB — URINALYSIS, ROUTINE W REFLEX MICROSCOPIC
Bilirubin Urine: NEGATIVE
Nitrite: NEGATIVE
Specific Gravity, Urine: >1.03 — ABNORMAL HIGH
pH: 6

## 2011-07-01 LAB — GC/CHLAMYDIA PROBE AMP, GENITAL: GC Probe Amp, Genital: NEGATIVE

## 2011-07-01 LAB — HEMOGLOBIN AND HEMATOCRIT, BLOOD: Hemoglobin: 13.4

## 2011-07-05 ENCOUNTER — Encounter (HOSPITAL_COMMUNITY): Payer: Self-pay | Admitting: *Deleted

## 2011-07-05 ENCOUNTER — Inpatient Hospital Stay (HOSPITAL_COMMUNITY): Payer: Managed Care, Other (non HMO)

## 2011-07-05 ENCOUNTER — Inpatient Hospital Stay (HOSPITAL_COMMUNITY)
Admission: AD | Admit: 2011-07-05 | Discharge: 2011-07-05 | Disposition: A | Discharge status: Home / Self Care | Payer: Managed Care, Other (non HMO) | Source: Ambulatory Visit | Attending: Obstetrics and Gynecology | Admitting: Obstetrics and Gynecology

## 2011-07-05 DIAGNOSIS — O99891 Other specified diseases and conditions complicating pregnancy: Secondary | ICD-10-CM | POA: Insufficient documentation

## 2011-07-05 NOTE — Progress Notes (Signed)
Pt c/o leaking a mod amt of fluid at church @ 1100 and con't leaking since then.

## 2011-07-05 NOTE — Progress Notes (Signed)
Had gush of fluid x 2 pad saturated, cramping.

## 2011-07-16 LAB — URINALYSIS, ROUTINE W REFLEX MICROSCOPIC
Bilirubin Urine: NEGATIVE
Ketones, ur: NEGATIVE
Nitrite: NEGATIVE
pH: 7.5

## 2011-07-16 LAB — WET PREP, GENITAL: Clue Cells Wet Prep HPF POC: NONE SEEN

## 2011-07-16 LAB — URINE MICROSCOPIC-ADD ON

## 2011-07-16 LAB — PREGNANCY, URINE: Preg Test, Ur: NEGATIVE

## 2011-07-16 LAB — GC/CHLAMYDIA PROBE AMP, GENITAL: Chlamydia, DNA Probe: NEGATIVE

## 2011-07-20 LAB — URINE MICROSCOPIC-ADD ON

## 2011-07-20 LAB — POCT PREGNANCY, URINE
Operator id: 29008
Preg Test, Ur: NEGATIVE

## 2011-07-20 LAB — URINALYSIS, ROUTINE W REFLEX MICROSCOPIC
Bilirubin Urine: NEGATIVE
Glucose, UA: NEGATIVE
Ketones, ur: NEGATIVE
pH: 6

## 2011-07-20 LAB — URINALYSIS, MICROSCOPIC ONLY
Bilirubin Urine: NEGATIVE
Ketones, ur: NEGATIVE
Nitrite: NEGATIVE
Urobilinogen, UA: 1

## 2011-07-20 LAB — URINE CULTURE: Colony Count: >=100000

## 2011-07-27 NOTE — H&P (Signed)
Nichole ANDRZEJEWSKI is a 30 y.o. female [phone] presenting for scheduled repeat cesarean section. Pt with h/o prior c/section for twins. Desires repeat without TOL. Pt transferred obstetric care to [facility] @ 22 weeks of pregnancy.  Pregnancy has been uncomplicated to this point History OB History    Grav Para Term Preterm Abortions TAB SAB Ect Mult Living   3 1 1  0 1 0 0 0 1 2     Past Medical History  Diagnosis Date  . Asthma   . Abnormal Pap smear   . Ovarian cyst   . Gonorrhea 2007  . Urinary tract infection    Past Surgical History  Procedure Date  . Cesarean section   . Dilation and curettage of uterus    Family History: family history includes Cancer in her mother and paternal aunt and Diabetes in her maternal grandfather and mother. Social History:  reports that she has never smoked. She has never used smokeless tobacco. She reports that she does not drink alcohol or use illicit drugs.  ROS   low back pain Active fetal movement  Exam Physical Exam   AOX3, NAD Gravid Soft NT/ND Cervix deferred  Prenatal labs: 01/12/11 ABO, Rh:  A+ Antibody:  Neg Rubella:  Imm RPR:   NR HBsAg:   Neg HIV:   NR GBS:   Neg 07/17/11  Assessment/Plan: 30 yo G3P1012 @ 39+ for scheduled repeat c/s 1) Admit 2) SCDs 3) Consent 4) Cefoxitin 2 gm  Adonias Demore H. 07/27/2011, 1:41 PM

## 2011-07-28 ENCOUNTER — Inpatient Hospital Stay (HOSPITAL_COMMUNITY)
Admission: AD | Admit: 2011-07-28 | Discharge: 2011-07-28 | Disposition: A | Discharge status: Home / Self Care | Payer: Managed Care, Other (non HMO) | Source: Ambulatory Visit | Attending: Obstetrics and Gynecology | Admitting: Obstetrics and Gynecology

## 2011-07-28 ENCOUNTER — Encounter (HOSPITAL_COMMUNITY)
Admission: RE | Admit: 2011-07-28 | Discharge: 2011-07-28 | Disposition: A | Discharge status: Home / Self Care | Payer: Managed Care, Other (non HMO) | Source: Ambulatory Visit

## 2011-07-28 ENCOUNTER — Encounter (HOSPITAL_COMMUNITY): Payer: Self-pay

## 2011-07-28 ENCOUNTER — Encounter (HOSPITAL_COMMUNITY): Payer: Self-pay | Admitting: *Deleted

## 2011-07-28 DIAGNOSIS — Z331 Pregnant state, incidental: Secondary | ICD-10-CM

## 2011-07-28 DIAGNOSIS — K529 Noninfective gastroenteritis and colitis, unspecified: Secondary | ICD-10-CM

## 2011-07-28 DIAGNOSIS — O479 False labor, unspecified: Secondary | ICD-10-CM | POA: Insufficient documentation

## 2011-07-28 DIAGNOSIS — K5289 Other specified noninfective gastroenteritis and colitis: Secondary | ICD-10-CM

## 2011-07-28 LAB — CBC
HCT: 36.9 % (ref 36.0–46.0)
MCH: 29.5 pg (ref 26.0–34.0)
MCHC: 33.1 g/dL (ref 30.0–36.0)
MCV: 89.1 fL (ref 78.0–100.0)
Platelets: 182 10*3/uL (ref 150–400)
RDW: 14.3 % (ref 11.5–15.5)

## 2011-07-28 LAB — URINE MICROSCOPIC-ADD ON

## 2011-07-28 LAB — SURGICAL PCR SCREEN: Staphylococcus aureus: NEGATIVE

## 2011-07-28 LAB — URINALYSIS, ROUTINE W REFLEX MICROSCOPIC
Ketones, ur: NEGATIVE mg/dL
Nitrite: NEGATIVE
Protein, ur: NEGATIVE mg/dL
Urobilinogen, UA: 0.2 mg/dL (ref 0.0–1.0)

## 2011-07-28 NOTE — Patient Instructions (Addendum)
   Your procedure is scheduled on:  Wednesday, Oct 24th  Enter through the [employer] of [facility] at: 12 noon Pick up the phone at the desk and dial [phone] and inform us of your arrival.  Please call this number if you have any problems the morning of surgery: [phone]  Remember: Do not eat food after midnight: Tuesday Do not drink clear liquids after:  Wednesday 9:30am Take these medicines the morning of surgery with a SIP OF WATER: Zantac and zofran if needed  Do not wear jewelry, make-up, or FINGER nail polish Do not wear lotions, powders, or perfumes.  You may wear deodorant. Do not shave 48 hours prior to surgery. Do not bring valuables to the hospital.  Leave suitcase in the car. After Surgery it may be brought to your room. For patients being admitted to the hospital, checkout time is 11:00am the day of discharge.  Patients discharged on the day of surgery will not be allowed to drive home.   Name and phone number of your driver:  husband Casimiro Needle  cell [phone]  Remember to use your hibiclens as instructed.Please shower with 1/2 bottle the evening before your surgery and the other 1/2 bottle the morning of surgery.

## 2011-07-28 NOTE — Progress Notes (Signed)
Patient was her for her preop appointment for a scheduled repeat cesarean section on 10-24 and told the RN she was having contractions. States contractions about 3 per hour. Started having nausea and vomiting with diarrhea during the night. No leaking or bleeding and reports good fetal movement.

## 2011-07-28 NOTE — ED Provider Notes (Signed)
History     Chief Complaint  Patient presents with  . Contractions   HPIKirsten D Woodards-Bailey30 y.o. patient of Dr. Marzetta Board came to [facility] today for her pre-op appointment. G3 [id number] (twin gestation).  She is 39w gestation and has a repeat C-Section scheduled for tomorrow.  She was brought to MAU from pre-op because she reported contractions which are daily unchanged, diarrhea began at 3am and at 6am began with nausea and vomiting X 4.  Reports "my stomach is flip-flopping".  + fetal movement.  Denies vaginal bleeding or loss of fluid.  By 8:30 this am was able to keep toast and fruit down.  Since in MAU has been able to keep fluids down.    Past Medical History  Diagnosis Date  . Asthma   . Abnormal Pap smear   . Ovarian cyst   . Gonorrhea 2007  . Urinary tract infection     Past Surgical History  Procedure Date  . Cesarean section   . Dilation and curettage of uterus     Family History  Problem Relation Age of Onset  . Cancer Mother   . Diabetes Mother   . Cancer Paternal Aunt   . Diabetes Maternal Grandfather     History  Substance Use Topics  . Smoking status: Never Smoker   . Smokeless tobacco: Never Used  . Alcohol Use: No    Allergies:  Allergies  Allergen Reactions  . Aspirin Anaphylaxis    Pt says she can take ibuprofen  . Mushroom Extract Complex Swelling and Rash    Prescriptions prior to admission  Medication Sig Dispense Refill  . budesonide-formoterol (SYMBICORT) 80-4.5 MCG/ACT inhaler Inhale 2 puffs into the lungs 2 (two) times daily.        Marland Kitchen albuterol (PROVENTIL HFA;VENTOLIN HFA) 108 (90 BASE) MCG/ACT inhaler Inhale 2 puffs into the lungs every 6 (six) hours as needed. Shortness of breath      . Budesonide-Formoterol Fumarate (SYMBICORT IN) Inhale 2 puffs into the lungs 2 (two) times daily.       . metoCLOPramide (REGLAN) 10 MG tablet Take 10 mg by mouth 4 (four) times daily as needed. For nausea.      . ondansetron (ZOFRAN) 8 MG  tablet Take 8 mg by mouth every 8 (eight) hours as needed. For nausea.       . prenatal vitamin w/FE, FA (PRENATAL 1 + 1) 27-1 MG TABS Take 1 tablet by mouth daily.        . ranitidine (ZANTAC) 75 MG tablet Take 75 mg by mouth 2 (two) times daily.          ROS Physical Exam   Blood pressure 99/65, pulse 88, temperature 98.3 F (36.8 C), temperature source Oral, resp. rate 20, height 5\' 11"  (1.803 m), weight 230 lb 6.4 oz (104.509 kg), SpO2 98.00%, unknown if currently breastfeeding.  Physical Exam  Cervical exam by Junious Dresser, RN Cx 1cm dilated, 70% effaced, -1 to 0 station,vtx.    MAU Course  Procedures  MDM Called Dr. Henderson Cloud to report patient's sxs.  She was aware patient was here.  Reported FMS, no further nausea, vomiting or diarrhea.  Order given to check cervix.  If unchanged, call PRE-OP for them to complete pre-op evaluation.     Assessment and Plan  A: [redacted] weeks gestation with probably viral illness.  P: Instructed to stay well hydrated until cut off time for schedule C-Section for tomorrow.  Call her doctor if her sxs worsen.  KEY,EVE M 07/28/2011, 12:10 PM   Matt Holmes, NP 07/28/11 1304

## 2011-07-29 ENCOUNTER — Encounter (HOSPITAL_COMMUNITY): Payer: Self-pay | Admitting: Anesthesiology

## 2011-07-29 ENCOUNTER — Encounter (HOSPITAL_COMMUNITY): Payer: Self-pay | Admitting: *Deleted

## 2011-07-29 ENCOUNTER — Inpatient Hospital Stay (HOSPITAL_COMMUNITY): Payer: Managed Care, Other (non HMO) | Admitting: Anesthesiology

## 2011-07-29 ENCOUNTER — Encounter (HOSPITAL_COMMUNITY)
Admission: RE | Disposition: A | Discharge status: Home / Self Care | Payer: Self-pay | Source: Ambulatory Visit | Attending: Obstetrics and Gynecology

## 2011-07-29 ENCOUNTER — Inpatient Hospital Stay (HOSPITAL_COMMUNITY)
Admission: RE | Admit: 2011-07-29 | Discharge: 2011-08-01 | DRG: 766 | Disposition: A | Discharge status: Home / Self Care | Payer: Managed Care, Other (non HMO) | Source: Ambulatory Visit | Attending: Obstetrics and Gynecology | Admitting: Obstetrics and Gynecology

## 2011-07-29 DIAGNOSIS — Z01818 Encounter for other preprocedural examination: Secondary | ICD-10-CM

## 2011-07-29 DIAGNOSIS — Z01812 Encounter for preprocedural laboratory examination: Secondary | ICD-10-CM

## 2011-07-29 DIAGNOSIS — O34219 Maternal care for unspecified type scar from previous cesarean delivery: Principal | ICD-10-CM | POA: Diagnosis present

## 2011-07-29 LAB — TYPE AND SCREEN

## 2011-07-29 SURGERY — Surgical Case
Anesthesia: Spinal | Site: Abdomen | Wound class: Clean Contaminated

## 2011-07-29 MED ORDER — MORPHINE SULFATE (PF) 0.5 MG/ML IJ SOLN
INTRAMUSCULAR | Status: DC | PRN
  Administered 2011-07-29: .1 mg via INTRATHECAL

## 2011-07-29 MED ORDER — SODIUM CHLORIDE 0.9 % IV SOLN: INTRAVENOUS | Status: DC

## 2011-07-29 MED ORDER — SODIUM CHLORIDE 0.9 % IJ SOLN: 3.0000 mL | INTRAMUSCULAR | Status: DC | PRN

## 2011-07-29 MED ORDER — ALBUTEROL SULFATE HFA 108 (90 BASE) MCG/ACT IN AERS: 2.0000 | INHALATION_SPRAY | Freq: Four times a day (QID) | RESPIRATORY_TRACT | Status: DC | PRN

## 2011-07-29 MED ORDER — PRENATAL PLUS 27-1 MG PO TABS
1.0000 | ORAL_TABLET | Freq: Every day | ORAL | Status: DC
  Administered 2011-07-30 – 2011-08-01 (×3): 1 via ORAL
  Filled 2011-07-29 (×3): qty 1

## 2011-07-29 MED ORDER — ONDANSETRON HCL 4 MG PO TABS: 4.0000 mg | ORAL_TABLET | ORAL | Status: DC | PRN

## 2011-07-29 MED ORDER — DEXTROSE 5 % IV SOLN
2.0000 g | INTRAVENOUS | Status: DC
  Filled 2011-07-29: qty 2

## 2011-07-29 MED ORDER — INFLUENZA VIRUS VACC SPLIT PF IM SUSP
0.5000 mL | Freq: Once | INTRAMUSCULAR | Status: AC
  Administered 2011-07-30: 0.5 mL via INTRAMUSCULAR
  Filled 2011-07-29: qty 0.5

## 2011-07-29 MED ORDER — NALOXONE HCL 0.4 MG/ML IJ SOLN: 0.4000 mg | INTRAMUSCULAR | Status: DC | PRN

## 2011-07-29 MED ORDER — MORPHINE SULFATE 0.5 MG/ML IJ SOLN
INTRAMUSCULAR | Status: AC
  Filled 2011-07-29: qty 10

## 2011-07-29 MED ORDER — METOCLOPRAMIDE HCL 5 MG/ML IJ SOLN
INTRAMUSCULAR | Status: AC
  Administered 2011-07-29: 10 mg
  Filled 2011-07-29: qty 2

## 2011-07-29 MED ORDER — PROMETHAZINE HCL 25 MG/ML IJ SOLN
INTRAMUSCULAR | Status: AC
  Administered 2011-07-29: 6.25 mg via INTRAVENOUS
  Filled 2011-07-29: qty 1

## 2011-07-29 MED ORDER — DIPHENHYDRAMINE HCL 25 MG PO CAPS: 25.0000 mg | ORAL_CAPSULE | Freq: Four times a day (QID) | ORAL | Status: DC | PRN

## 2011-07-29 MED ORDER — ONDANSETRON HCL 4 MG/2ML IJ SOLN
INTRAMUSCULAR | Status: AC
  Filled 2011-07-29: qty 2

## 2011-07-29 MED ORDER — LACTATED RINGERS IV SOLN
INTRAVENOUS | Status: DC
  Administered 2011-07-29 – 2011-07-30 (×2): via INTRAVENOUS

## 2011-07-29 MED ORDER — SCOPOLAMINE 1 MG/3DAYS TD PT72
1.0000 | MEDICATED_PATCH | Freq: Once | TRANSDERMAL | Status: DC
  Administered 2011-07-29: 1.5 mg via TRANSDERMAL

## 2011-07-29 MED ORDER — SENNOSIDES-DOCUSATE SODIUM 8.6-50 MG PO TABS
2.0000 | ORAL_TABLET | Freq: Every day | ORAL | Status: DC
  Administered 2011-07-29 – 2011-07-31 (×3): 2 via ORAL

## 2011-07-29 MED ORDER — DIPHENHYDRAMINE HCL 50 MG/ML IJ SOLN: 25.0000 mg | INTRAMUSCULAR | Status: DC | PRN

## 2011-07-29 MED ORDER — DIPHENHYDRAMINE HCL 25 MG PO CAPS: 25.0000 mg | ORAL_CAPSULE | ORAL | Status: DC | PRN

## 2011-07-29 MED ORDER — DIPHENHYDRAMINE HCL 50 MG/ML IJ SOLN
12.5000 mg | INTRAMUSCULAR | Status: DC | PRN
  Administered 2011-07-30: 12.5 mg via INTRAVENOUS
  Filled 2011-07-29: qty 1

## 2011-07-29 MED ORDER — ACETAMINOPHEN 10 MG/ML IV SOLN: 1000.0000 mg | Freq: Four times a day (QID) | INTRAVENOUS | Status: AC | PRN

## 2011-07-29 MED ORDER — ONDANSETRON HCL 4 MG/2ML IJ SOLN: 4.0000 mg | Freq: Three times a day (TID) | INTRAMUSCULAR | Status: DC | PRN

## 2011-07-29 MED ORDER — OXYTOCIN 20 UNITS IN LACTATED RINGERS INFUSION - SIMPLE
INTRAVENOUS | Status: AC
  Filled 2011-07-29: qty 1000

## 2011-07-29 MED ORDER — WITCH HAZEL-GLYCERIN EX PADS: 1.0000 "application " | MEDICATED_PAD | CUTANEOUS | Status: DC | PRN

## 2011-07-29 MED ORDER — FENTANYL CITRATE 0.05 MG/ML IJ SOLN
INTRAMUSCULAR | Status: DC | PRN
  Administered 2011-07-29: 25 ug via INTRATHECAL

## 2011-07-29 MED ORDER — TETANUS-DIPHTH-ACELL PERTUSSIS 5-2.5-18.5 LF-MCG/0.5 IM SUSP
0.5000 mL | Freq: Once | INTRAMUSCULAR | Status: AC
  Administered 2011-07-31: 0.5 mL via INTRAMUSCULAR
  Filled 2011-07-29: qty 0.5

## 2011-07-29 MED ORDER — ACETAMINOPHEN 325 MG PO TABS: 325.0000 mg | ORAL_TABLET | ORAL | Status: DC | PRN

## 2011-07-29 MED ORDER — BUPIVACAINE IN DEXTROSE 0.75-8.25 % IT SOLN
INTRATHECAL | Status: DC | PRN
  Administered 2011-07-29: 12 mg via INTRATHECAL

## 2011-07-29 MED ORDER — PHENYLEPHRINE 40 MCG/ML (10ML) SYRINGE FOR IV PUSH (FOR BLOOD PRESSURE SUPPORT)
PREFILLED_SYRINGE | INTRAVENOUS | Status: AC
  Filled 2011-07-29: qty 5

## 2011-07-29 MED ORDER — MENTHOL 3 MG MT LOZG: 1.0000 | LOZENGE | OROMUCOSAL | Status: DC | PRN

## 2011-07-29 MED ORDER — PHENYLEPHRINE HCL 10 MG/ML IJ SOLN
INTRAMUSCULAR | Status: DC | PRN
  Administered 2011-07-29: 200 ug via INTRAVENOUS
  Administered 2011-07-29: 120 ug via INTRAVENOUS
  Administered 2011-07-29: 200 ug via INTRAVENOUS
  Administered 2011-07-29: 80 ug via INTRAVENOUS

## 2011-07-29 MED ORDER — IBUPROFEN 600 MG PO TABS
600.0000 mg | ORAL_TABLET | Freq: Four times a day (QID) | ORAL | Status: DC
  Administered 2011-07-29 – 2011-08-01 (×11): 600 mg via ORAL
  Filled 2011-07-29 (×5): qty 1

## 2011-07-29 MED ORDER — OXYTOCIN 20 UNITS IN LACTATED RINGERS INFUSION - SIMPLE: 125.0000 mL/h | INTRAVENOUS | Status: AC

## 2011-07-29 MED ORDER — FENTANYL CITRATE 0.05 MG/ML IJ SOLN
25.0000 ug | INTRAMUSCULAR | Status: DC | PRN
  Administered 2011-07-29 (×2): 50 ug via INTRAVENOUS

## 2011-07-29 MED ORDER — NALBUPHINE HCL 10 MG/ML IJ SOLN: 5.0000 mg | INTRAMUSCULAR | Status: DC | PRN

## 2011-07-29 MED ORDER — SCOPOLAMINE 1 MG/3DAYS TD PT72
MEDICATED_PATCH | TRANSDERMAL | Status: AC
  Filled 2011-07-29: qty 1

## 2011-07-29 MED ORDER — MORPHINE SULFATE 4 MG/ML IJ SOLN
1.0000 mg | Freq: Once | INTRAMUSCULAR | Status: AC
  Administered 2011-07-29: 1 mg via INTRAVENOUS
  Filled 2011-07-29: qty 1

## 2011-07-29 MED ORDER — DIBUCAINE 1 % RE OINT: 1.0000 "application " | TOPICAL_OINTMENT | RECTAL | Status: DC | PRN

## 2011-07-29 MED ORDER — FENTANYL CITRATE 0.05 MG/ML IJ SOLN
INTRAMUSCULAR | Status: AC
  Administered 2011-07-29: 50 ug via INTRAVENOUS
  Filled 2011-07-29: qty 2

## 2011-07-29 MED ORDER — OXYTOCIN 10 UNIT/ML IJ SOLN
INTRAMUSCULAR | Status: AC
  Filled 2011-07-29: qty 1

## 2011-07-29 MED ORDER — ONDANSETRON HCL 4 MG/2ML IJ SOLN
INTRAMUSCULAR | Status: DC | PRN
  Administered 2011-07-29: 4 mg via INTRAVENOUS

## 2011-07-29 MED ORDER — BUDESONIDE-FORMOTEROL FUMARATE 80-4.5 MCG/ACT IN AERO
2.0000 | INHALATION_SPRAY | Freq: Two times a day (BID) | RESPIRATORY_TRACT | Status: DC
  Administered 2011-07-29 – 2011-08-01 (×4): 2 via RESPIRATORY_TRACT
  Filled 2011-07-29: qty 6.9

## 2011-07-29 MED ORDER — OXYCODONE-ACETAMINOPHEN 5-325 MG PO TABS
1.0000 | ORAL_TABLET | ORAL | Status: DC | PRN
  Administered 2011-07-31: 1 via ORAL
  Filled 2011-07-29: qty 1

## 2011-07-29 MED ORDER — OXYTOCIN 20 UNITS IN LACTATED RINGERS INFUSION - SIMPLE
INTRAVENOUS | Status: DC | PRN
  Administered 2011-07-29: 20 [IU]
  Administered 2011-07-29: 20 [IU] via INTRAVENOUS

## 2011-07-29 MED ORDER — SCOPOLAMINE 1 MG/3DAYS TD PT72: 1.0000 | MEDICATED_PATCH | Freq: Once | TRANSDERMAL | Status: DC

## 2011-07-29 MED ORDER — SODIUM CHLORIDE 0.9 % IV SOLN: 1.0000 ug/kg/h | INTRAVENOUS | Status: DC | PRN

## 2011-07-29 MED ORDER — IBUPROFEN 600 MG PO TABS
600.0000 mg | ORAL_TABLET | Freq: Four times a day (QID) | ORAL | Status: DC | PRN
  Filled 2011-07-29 (×6): qty 1

## 2011-07-29 MED ORDER — MEPERIDINE HCL 25 MG/ML IJ SOLN: 6.2500 mg | INTRAMUSCULAR | Status: DC | PRN

## 2011-07-29 MED ORDER — PROMETHAZINE HCL 25 MG/ML IJ SOLN
6.2500 mg | INTRAMUSCULAR | Status: DC | PRN
  Administered 2011-07-29: 6.25 mg via INTRAVENOUS

## 2011-07-29 MED ORDER — NALBUPHINE HCL 10 MG/ML IJ SOLN
5.0000 mg | INTRAMUSCULAR | Status: DC | PRN
  Administered 2011-07-30: 5 mg via INTRAVENOUS
  Filled 2011-07-29: qty 1

## 2011-07-29 MED ORDER — METOCLOPRAMIDE HCL 5 MG/ML IJ SOLN: 10.0000 mg | Freq: Three times a day (TID) | INTRAMUSCULAR | Status: DC | PRN

## 2011-07-29 MED ORDER — LACTATED RINGERS IV SOLN
INTRAVENOUS | Status: DC
  Administered 2011-07-29 (×3): via INTRAVENOUS

## 2011-07-29 MED ORDER — ONDANSETRON HCL 4 MG/2ML IJ SOLN: 4.0000 mg | INTRAMUSCULAR | Status: DC | PRN

## 2011-07-29 MED ORDER — ZOLPIDEM TARTRATE 5 MG PO TABS
5.0000 mg | ORAL_TABLET | Freq: Every evening | ORAL | Status: DC | PRN
Start: 2011-07-29 — End: 2011-08-01

## 2011-07-29 MED ORDER — DEXTROSE 5 % IV SOLN
2.0000 g | INTRAVENOUS | Status: DC | PRN
  Administered 2011-07-29: 2 g via INTRAVENOUS

## 2011-07-29 MED ORDER — FENTANYL CITRATE 0.05 MG/ML IJ SOLN
INTRAMUSCULAR | Status: AC
  Filled 2011-07-29: qty 2

## 2011-07-29 MED ORDER — LANOLIN HYDROUS EX OINT: 1.0000 "application " | TOPICAL_OINTMENT | CUTANEOUS | Status: DC | PRN

## 2011-07-29 MED ORDER — SIMETHICONE 80 MG PO CHEW
80.0000 mg | CHEWABLE_TABLET | Freq: Three times a day (TID) | ORAL | Status: DC
  Administered 2011-07-29 – 2011-08-01 (×10): 80 mg via ORAL

## 2011-07-29 MED ORDER — SIMETHICONE 80 MG PO CHEW: 80.0000 mg | CHEWABLE_TABLET | ORAL | Status: DC | PRN

## 2011-07-29 SURGICAL SUPPLY — 29 items
CLOTH BEACON ORANGE TIMEOUT ST (SAFETY) ×2 IMPLANT
DRESSING TELFA 8X3 (GAUZE/BANDAGES/DRESSINGS) ×2 IMPLANT
DRSG COVADERM 4X8 (GAUZE/BANDAGES/DRESSINGS) ×1 IMPLANT
DRSG PAD ABDOMINAL 8X10 ST (GAUZE/BANDAGES/DRESSINGS) ×1 IMPLANT
ELECT REM PT RETURN 9FT ADLT (ELECTROSURGICAL) ×2
ELECTRODE REM PT RTRN 9FT ADLT (ELECTROSURGICAL) ×1 IMPLANT
EXTRACTOR VACUUM M CUP 4 TUBE (SUCTIONS) IMPLANT
GAUZE SPONGE 4X4 12PLY STRL LF (GAUZE/BANDAGES/DRESSINGS) ×4 IMPLANT
GLOVE BIO SURGEON STRL SZ7 (GLOVE) ×4 IMPLANT
GOWN PREVENTION PLUS LG XLONG (DISPOSABLE) ×4 IMPLANT
KIT ABG SYR 3ML LUER SLIP (SYRINGE) IMPLANT
NDL HYPO 25X5/8 SAFETYGLIDE (NEEDLE) IMPLANT
NEEDLE HYPO 25X5/8 SAFETYGLIDE (NEEDLE) IMPLANT
NS IRRIG 1000ML POUR BTL (IV SOLUTION) ×2 IMPLANT
PACK C SECTION WH (CUSTOM PROCEDURE TRAY) ×2 IMPLANT
PAD ABD 7.5X8 STRL (GAUZE/BANDAGES/DRESSINGS) ×2 IMPLANT
RTRCTR C-SECT PINK 25CM LRG (MISCELLANEOUS) IMPLANT
RTRCTR C-SECT PINK 34CM XLRG (MISCELLANEOUS) IMPLANT
SLEEVE SCD COMPRESS KNEE MED (MISCELLANEOUS) IMPLANT
SPONGE GAUZE 4X4 12PLY (GAUZE/BANDAGES/DRESSINGS) ×1 IMPLANT
STAPLER VISISTAT 35W (STAPLE) IMPLANT
SUT CHROMIC 1 CTX 36 (SUTURE) ×4 IMPLANT
SUT PDS AB 0 CTX 60 (SUTURE) ×2 IMPLANT
SUT VIC AB 2-0 CT1 27 (SUTURE) ×2
SUT VIC AB 2-0 CT1 TAPERPNT 27 (SUTURE) ×1 IMPLANT
TAPE CLOTH SURG 4X10 WHT LF (GAUZE/BANDAGES/DRESSINGS) ×1 IMPLANT
TOWEL OR 17X24 6PK STRL BLUE (TOWEL DISPOSABLE) ×4 IMPLANT
TRAY FOLEY CATH 14FR (SET/KITS/TRAYS/PACK) ×2 IMPLANT
WATER STERILE IRR 1000ML POUR (IV SOLUTION) ×2 IMPLANT

## 2011-07-29 NOTE — Interval H&P Note (Signed)
History and Physical Interval Note:   07/29/2011   12:49 PM   Nichole Kirk  has presented today for surgery, with the diagnosis of REPEAT  The various methods of treatment have been discussed with the patient and family. After consideration of risks, benefits and other options for treatment, the patient has consented to  Procedure(s): CESAREAN SECTION as a surgical intervention .  I have reviewed the patients' chart and labs.  Questions were answered to the patient's satisfaction.     Almon Hercules  MD     I have personally examined this patient and there have been no changes in the physical exam or medical history since the last office evaluation

## 2011-07-29 NOTE — Anesthesia Postprocedure Evaluation (Signed)
Anesthesia Post Note  Patient: Nichole Kirk  Procedure(s) Performed:  CESAREAN SECTION - Repeat  Anesthesia type: Spinal  Patient location: PACU  Post pain: Pain level controlled  Post assessment: Post-op Vital signs reviewed  Last Vitals:  Filed Vitals:   07/29/11 1530  BP: 104/87  Pulse: 94  Temp:   Resp: 22    Post vital signs: Reviewed  Level of consciousness: awake  Complications: No apparent anesthesia complications

## 2011-07-29 NOTE — Anesthesia Procedure Notes (Addendum)
Spinal Block  Patient location during procedure: OR Start time: 07/29/2011 1:43 PM Staffing Performed by: anesthesiologist  Preanesthetic Checklist Completed: patient identified, site marked, surgical consent, pre-op evaluation, timeout performed, IV checked, risks and benefits discussed and monitors and equipment checked Spinal Block Patient position: sitting Prep: DuraPrep Patient monitoring: heart rate, cardiac monitor, continuous pulse ox and blood pressure Approach: midline Location: L3-4 Injection technique: single-shot Needle Needle type: Sprotte  Needle gauge: 24 G Needle length: 9 cm Assessment Sensory level: T4 Additional Notes Patient identified.  Risk benefits discussed including failed block, incomplete pain control, headache, nerve damage, paralysis, blood pressure changes, nausea, vomiting, reactions to medication both toxic or allergic, and postpartum back pain.  Patient expressed understanding and wished to proceed.  All questions were answered.  Sterile technique used throughout procedure.  CSF was clear.  No parasthesia or other complications.  Please see nursing notes for vital signs.

## 2011-07-29 NOTE — Transfer of Care (Signed)
Immediate Anesthesia Transfer of Care Note  Patient: Nichole Kirk  Procedure(s) Performed:  CESAREAN SECTION - Repeat  Patient Location: PACU  Anesthesia Type: Spinal  Level of Consciousness: awake, alert  and oriented  Airway & Oxygen Therapy: Patient Spontanous Breathing  Post-op Assessment: Report given to PACU RN and Post -op Vital signs reviewed and stable  Post vital signs: Reviewed and stable  Complications: No apparent anesthesia complications

## 2011-07-29 NOTE — Preoperative (Signed)
Beta Blockers   Reason not to administer Beta Blockers:Not Applicable 

## 2011-07-29 NOTE — Anesthesia Preprocedure Evaluation (Signed)
Anesthesia Evaluation  Patient identified by MRN, date of birth, ID band Patient awake  General Assessment Comment  Reviewed: Allergy & Precautions, H&P , Patient's Chart, lab work & pertinent test results  Airway Mallampati: II TM Distance: >3 FB Neck ROM: full    Dental No notable dental hx.    Pulmonary asthma  clear to auscultation  Pulmonary exam normal       Cardiovascular regular Normal    Neuro/Psych Negative Neurological ROS  Negative Psych ROS   GI/Hepatic negative GI ROS Neg liver ROS    Endo/Other  Negative Endocrine ROS  Renal/GU negative Renal ROS     Musculoskeletal   Abdominal   Peds  Hematology negative hematology ROS (+)   Anesthesia Other Findings   Reproductive/Obstetrics (+) Pregnancy                           Anesthesia Physical Anesthesia Plan  ASA: II  Anesthesia Plan: Spinal   Post-op Pain Management:    Induction:   Airway Management Planned:   Additional Equipment:   Intra-op Plan:   Post-operative Plan:   Informed Consent: I have reviewed the patients History and Physical, chart, labs and discussed the procedure including the risks, benefits and alternatives for the proposed anesthesia with the patient or authorized representative who has indicated his/her understanding and acceptance.     Plan Discussed with:   Anesthesia Plan Comments:         Anesthesia Quick Evaluation

## 2011-07-29 NOTE — Op Note (Signed)
Preoperative diagnosis: 1) 39+1 Week intrauterine pregnancy 2) H/O prior cesarean section declines TOL Postoperative diagnosis: SAME Procedure: Repeat Cesarean Section Surgeon: Dr. Waynard Reeds Asst.: Dr. Ilda Mori Anesthesia: Spinal Operative findings: vigorous female infant in the vertex presentation weighing 8#3.8 oz with apgars 9 & 9. Luteinization of the bilateral ovaries were noted Specimen: Placenta Estimated blood loss: 700cc Urine output: Clear, 125cc IV fluids: Total I/O In: 3750 [I.V.:3750] Out: 825 [Urine:125; Blood:700]  Procedure: 30 -year-old gravida 3 para 1011 now 2012 who presents for a scheduled repeat cesarean section. The patient has a history of a prior primary cesarean section for history of a twin delivery. She was counseled as to her options of a trial of labor after cesarean section versus repeat cesarean section. Risks benefits and alternatives of both were discussed at length with the patient and she wished to proceed with repeat cesarean section . Following the appropriate informed consent, the patient was brought to the operating room where spinal anesthesia was administered and found to be adequate. The abdomen was prepped and draped in the  normal sterile fashion. A scalpel was then used to make a Pfannenstiel skin incision which was carried down to the underlying layers of soft tissue to the fascia. The fascia was incised in the midline and the fascial incision was extended laterally with Mayo scissors.The superior aspect of the fascial incision was grasped with Coker clamps x2, tented up, the underlying rectus muscles dissected off sharply with the electrocautery unit. The same procedure was repeated on the inferior aspect of the fascial incision. The abdominal peritoneum was identified, entered bluntly, and the incision was extended superiorly and inferiorly with good visualization of the bladder. The Alexis retractor was then deployed. The vesicouterine peritoneum  was identified, tented up, entered, the incision was extended laterally, and the bladder flap was created digitally. The scalpel was then used to make a low transverse incision on the uterus which was a extended with blunt dissection. Amniotomy was performed for clear fluid. The fetal vertex was identified, elevated to the uterine incision was delivered easily, followed by the body. The infant cried vigorously on the operative field. The cord was clamped and cut and the infant was passed to the waiting pediatric team. The placenta was then spontaneously delivered, the uterus was cleared of all clot and debris. The uterine incision was then repaired with #1 chromic in a running locked fashion. An additional figure-of-eight suture was required for complete hemostasis. The uterine incision was noted to be hemostatic. Luteinization of the ovaries was noted bilaterally. The Alexis retractor was removed the abdominal cavity was cleared of all clot and debris. The abdominal peritoneum was reapproximated with 2-0 Vicryl. The rectus muscle was reapproximated with #1 chromic. The fascia was closed with 0-looped PDS and the skin was closed with staples. All sponge, lap, and needle counts were correct x2. The patient tolerated the procedure well and was brought to the recovery room in stable condition following the procedure.

## 2011-07-29 NOTE — Anesthesia Postprocedure Evaluation (Signed)
Anesthesia Post Note  Patient: Nichole Kirk  Procedure(s) Performed:  CESAREAN SECTION - Repeat  Anesthesia type: Spinal  Patient location: PACU  Post pain: Pain level controlled  Post assessment: Post-op Vital signs reviewed  Last Vitals:  Filed Vitals:   07/29/11 1500  BP: 98/51  Pulse: 81  Temp:   Resp: 22    Post vital signs: Reviewed  Level of consciousness: awake  Complications: No apparent anesthesia complications

## 2011-07-30 ENCOUNTER — Encounter (HOSPITAL_COMMUNITY): Payer: Self-pay | Admitting: Obstetrics and Gynecology

## 2011-07-30 LAB — CBC
MCH: 29.7 pg (ref 26.0–34.0)
Platelets: 186 10*3/uL (ref 150–400)
RBC: 3.84 MIL/uL — ABNORMAL LOW (ref 3.87–5.11)

## 2011-07-30 NOTE — Progress Notes (Signed)
Stable.  To arrange circ in office.  Breast feeding.  CBC: 11.4/15.1 with 186K platelets.  Fundus firm and pad and dressing dry.

## 2011-07-30 NOTE — Addendum Note (Signed)
Addendum  created 07/30/11 1610 by Suella Grove   Modules edited:Notes Section

## 2011-07-30 NOTE — Anesthesia Postprocedure Evaluation (Signed)
  Anesthesia Post-op Note  Patient: Nichole Kirk  Procedure(s) Performed:  CESAREAN SECTION - Repeat  Patient Location: Mother/Baby  Anesthesia Type: Spinal  Level of Consciousness: awake  Airway and Oxygen Therapy: Patient Spontanous Breathing  Post-op Pain: none  Post-op Assessment: Post-op Vital signs reviewed  Post-op Vital Signs: Reviewed and stable  Complications: No apparent anesthesia complications

## 2011-07-31 NOTE — Discharge Summary (Signed)
Obstetric Discharge Summary Reason for Admission: cesarean section Prenatal Procedures: ultrasound Intrapartum Procedures: cesarean: low cervical, transverse Postpartum Procedures: none Complications-Operative and Postpartum: none Hemoglobin  Date Value Range Status  07/30/2011 11.4* 12.0-15.0 (g/dL) Final     HCT  Date Value Range Status  07/30/2011 34.1* 36.0-46.0 (%) Final    Discharge Diagnoses: Term Pregnancy-delivered  Discharge Information: Date: 07/31/2011 Activity: pelvic rest Diet: routine Medications: Ibuprofen and Percocet Condition: stable Instructions: refer to practice specific booklet Discharge to: home Follow-up Information    Follow up with [name] in 4 weeks. (For postpartum evaluation)    Contact information:   [address] [place] [address] [phone]          Newborn Data: Live born female  Birth Weight: 8 lb 3.8 oz (3737 g) APGAR: 9, 9  Home with mother.  Nichole Vallone H. 07/31/2011, 10:58 PM

## 2011-07-31 NOTE — Progress Notes (Signed)
  POD#2 s/p c/s  Pain controlled, tol po, baby having issues with breastfeeding Filed Vitals:   07/30/11 1358 07/30/11 1400 07/30/11 2107 07/31/11 0503  BP: 105/65  97/62 94/62  Pulse: 91  86 66  Temp: 98 F (36.7 C)  98.1 F (36.7 C) 98.5 F (36.9 C)  TempSrc: Oral  Oral Oral  Resp: 18  18 18   Height:      Weight:      SpO2:  99%    incision: c/d/i LE nontender  A/p Routine po care Keep until tomorrow d/t sluggish feeding

## 2011-08-01 MED ORDER — IBUPROFEN 600 MG PO TABS: 600.0000 mg | ORAL_TABLET | Freq: Four times a day (QID) | ORAL | Status: AC

## 2011-08-01 MED ORDER — OXYCODONE-ACETAMINOPHEN 5-325 MG PO TABS: 1.0000 | ORAL_TABLET | ORAL | Status: AC | PRN

## 2011-08-07 ENCOUNTER — Encounter (HOSPITAL_COMMUNITY): Payer: Managed Care, Other (non HMO)

## 2011-09-11 ENCOUNTER — Encounter (HOSPITAL_COMMUNITY): Payer: Self-pay

## 2011-09-11 ENCOUNTER — Inpatient Hospital Stay (HOSPITAL_COMMUNITY)
Admission: AD | Admit: 2011-09-11 | Discharge: 2011-09-11 | Disposition: A | Discharge status: Home / Self Care | Payer: Managed Care, Other (non HMO) | Source: Ambulatory Visit | Attending: Obstetrics and Gynecology | Admitting: Obstetrics and Gynecology

## 2011-09-11 ENCOUNTER — Inpatient Hospital Stay (HOSPITAL_COMMUNITY): Payer: Managed Care, Other (non HMO)

## 2011-09-11 DIAGNOSIS — N760 Acute vaginitis: Secondary | ICD-10-CM

## 2011-09-11 DIAGNOSIS — R1031 Right lower quadrant pain: Secondary | ICD-10-CM | POA: Insufficient documentation

## 2011-09-11 DIAGNOSIS — B9689 Other specified bacterial agents as the cause of diseases classified elsewhere: Secondary | ICD-10-CM | POA: Insufficient documentation

## 2011-09-11 DIAGNOSIS — A499 Bacterial infection, unspecified: Secondary | ICD-10-CM

## 2011-09-11 DIAGNOSIS — R109 Unspecified abdominal pain: Secondary | ICD-10-CM

## 2011-09-11 LAB — CBC
Hemoglobin: 12.6 g/dL (ref 12.0–15.0)
MCHC: 32.6 g/dL (ref 30.0–36.0)
RBC: 4.37 MIL/uL (ref 3.87–5.11)
WBC: 7.4 10*3/uL (ref 4.0–10.5)

## 2011-09-11 LAB — URINALYSIS, ROUTINE W REFLEX MICROSCOPIC
Glucose, UA: NEGATIVE mg/dL
Leukocytes, UA: NEGATIVE
Protein, ur: NEGATIVE mg/dL
Specific Gravity, Urine: 1.025 (ref 1.005–1.030)
pH: 6 (ref 5.0–8.0)

## 2011-09-11 LAB — WET PREP, GENITAL
Trich, Wet Prep: NONE SEEN
Yeast Wet Prep HPF POC: NONE SEEN

## 2011-09-11 LAB — POCT PREGNANCY, URINE: Preg Test, Ur: NEGATIVE

## 2011-09-11 MED ORDER — IBUPROFEN 800 MG PO TABS
800.0000 mg | ORAL_TABLET | Freq: Once | ORAL | Status: AC
  Administered 2011-09-11: 800 mg via ORAL
  Filled 2011-09-11: qty 1

## 2011-09-11 MED ORDER — METRONIDAZOLE 500 MG PO TABS: 500.0000 mg | ORAL_TABLET | Freq: Two times a day (BID) | ORAL | Status: AC

## 2011-09-11 MED ORDER — HYDROCODONE-ACETAMINOPHEN 5-500 MG PO TABS: 1.0000 | ORAL_TABLET | Freq: Four times a day (QID) | ORAL | Status: AC | PRN

## 2011-09-11 NOTE — Progress Notes (Signed)
Pt has had pain x3 days, today pain is constant, was on toilet from 0430-0630 trying to have bowel mvmt. Denies uti s/s. BM consistency wnl.

## 2011-09-11 NOTE — Progress Notes (Signed)
Patient states she had a cesarean section on 10-24. Has been having pain on the right side about waist level and downward for the past three mornings. Wakes her up and last up to 30 minutes. Today the pain continues.

## 2011-09-11 NOTE — ED Notes (Signed)
Pt with significant allergy to aspirin and asthma, difficulty finding medication for pt to take for pain with pt driving home.

## 2011-09-11 NOTE — ED Provider Notes (Signed)
History   Pt presents today c/o severe RLQ pain for the past 3 days. She denies vag dc, bleeding, or fever. She states the pain "comes and goes" and if feels like she needs to have a BM. However, she reports NL BMs. Her last episode of intercourse was yesterday and was not painful. She has no other complaints at this time.  No chief complaint on file.  HPI  OB History    Grav Para Term Preterm Abortions TAB SAB Ect Mult Living   3 2 2  0 1 0 0 0 1 3      Past Medical History  Diagnosis Date  . Abnormal Pap smear   . Ovarian cyst   . Gonorrhea 2007  . Urinary tract infection   . Asthma     albuterol inhaler    Past Surgical History  Procedure Date  . Cesarean section   . Dilation and curettage of uterus   . Mab     x 1 - no surgery required 2009  . Wisdom tooth extraction   . Cesarean section 07/29/2011    Procedure: CESAREAN SECTION;  Surgeon: Almon Hercules;  Location: [facility];  Service: Gynecology;  Laterality: N/A;  Repeat    Family History  Problem Relation Age of Onset  . Cancer Mother   . Diabetes Mother   . Cancer Paternal Aunt   . Diabetes Maternal Grandfather     History  Substance Use Topics  . Smoking status: Never Smoker   . Smokeless tobacco: Never Used  . Alcohol Use: Yes     none since pregnancy    Allergies:  Allergies  Allergen Reactions  . Aspirin Anaphylaxis    Pt says she can take ibuprofen  . Mushroom Extract Complex Swelling and Rash    Prescriptions prior to admission  Medication Sig Dispense Refill  . albuterol (PROVENTIL HFA;VENTOLIN HFA) 108 (90 BASE) MCG/ACT inhaler Inhale 2 puffs into the lungs every 6 (six) hours as needed. Shortness of breath      . budesonide-formoterol (SYMBICORT) 80-4.5 MCG/ACT inhaler Inhale 2 puffs into the lungs 2 (two) times daily.        . prenatal vitamin w/FE, FA (PRENATAL 1 + 1) 27-1 MG TABS Take 1 tablet by mouth daily.          Review of Systems  Constitutional: Negative for fever.    Cardiovascular: Negative for chest pain.  Gastrointestinal: Positive for nausea and abdominal pain. Negative for vomiting, diarrhea and constipation.  Genitourinary: Negative for dysuria, urgency, frequency and hematuria.  Neurological: Negative for dizziness and headaches.  Psychiatric/Behavioral: Negative for depression and suicidal ideas.   Physical Exam   Blood pressure 90/70, pulse 67, temperature 98.2 F (36.8 C), temperature source Oral, resp. rate 20, height 5\' 11"  (1.803 m), weight 212 lb 3.2 oz (96.253 kg), SpO2 98.00%, currently breastfeeding.  Physical Exam  Nursing note and vitals reviewed. Constitutional: She is oriented to person, place, and time. She appears well-developed and well-nourished. No distress.  HENT:  Head: Normocephalic and atraumatic.  Eyes: EOM are normal. Pupils are equal, round, and reactive to light.  GI: Soft. She exhibits no distension and no mass. There is tenderness. There is no rebound and no guarding.  Genitourinary: Uterus normal. No bleeding around the vagina. Vaginal discharge found.       Cervix Lg/closed. Pt tender to Rt adnexa and over uterus. Uterus NL size and shape. No definite adnexal masses noted.  Neurological: She is alert and  oriented to person, place, and time.  Skin: Skin is warm and dry. She is not diaphoretic.  Psychiatric: She has a normal mood and affect. Her behavior is normal. Judgment and thought content normal.    MAU Course  Procedures  Wet prep done.  Results for orders placed during the hospital encounter of 09/11/11 (from the past 48 hour(s))  URINALYSIS, ROUTINE W REFLEX MICROSCOPIC     Status: Normal   Collection Time   09/11/11  7:35 AM      Component Value Range Comment   Color, Urine YELLOW  YELLOW     APPearance CLEAR  CLEAR     Specific Gravity, Urine 1.025  1.005 - 1.030     pH 6.0  5.0 - 8.0     Glucose, UA NEGATIVE  NEGATIVE (mg/dL)    Hgb urine dipstick NEGATIVE  NEGATIVE     Bilirubin Urine  NEGATIVE  NEGATIVE     Ketones, ur NEGATIVE  NEGATIVE (mg/dL)    Protein, ur NEGATIVE  NEGATIVE (mg/dL)    Urobilinogen, UA 1.0  0.0 - 1.0 (mg/dL)    Nitrite NEGATIVE  NEGATIVE     Leukocytes, UA NEGATIVE  NEGATIVE  MICROSCOPIC NOT DONE ON URINES WITH NEGATIVE PROTEIN, BLOOD, LEUKOCYTES, NITRITE, OR GLUCOSE <1000 mg/dL.  POCT PREGNANCY, URINE     Status: Normal   Collection Time   09/11/11  7:43 AM      Component Value Range Comment   Preg Test, Ur NEGATIVE     WET PREP, GENITAL     Status: Abnormal   Collection Time   09/11/11  8:02 AM      Component Value Range Comment   Yeast, Wet Prep NONE SEEN  NONE SEEN     Trich, Wet Prep NONE SEEN  NONE SEEN     Clue Cells, Wet Prep MODERATE (*) NONE SEEN     WBC, Wet Prep HPF POC FEW (*) NONE SEEN  MODERATE BACTERIA SEEN  CBC     Status: Normal   Collection Time   09/11/11  8:05 AM      Component Value Range Comment   WBC 7.4  4.0 - 10.5 (K/uL)    RBC 4.37  3.87 - 5.11 (MIL/uL)    Hemoglobin 12.6  12.0 - 15.0 (g/dL)    HCT 16.1  09.6 - 04.5 (%)    MCV 88.6  78.0 - 100.0 (fL)    MCH 28.8  26.0 - 34.0 (pg)    MCHC 32.6  30.0 - 36.0 (g/dL)    RDW 40.9  81.1 - 91.4 (%)    Platelets 172  150 - 400 (K/uL)    US Transvaginal Non-ob  09/11/2011  *RADIOLOGY REPORT*  Clinical Data: Right pelvic pain, cesarean section on 07/29/2011  TRANSABDOMINAL AND TRANSVAGINAL ULTRASOUND OF PELVIS Technique:  Both transabdominal and transvaginal ultrasound examinations of the pelvis were performed. Transabdominal technique was performed for global imaging of the pelvis including uterus, ovaries, adnexal regions, and pelvic cul-de-sac.  Comparison: OB ultrasound 07/05/2011   It was necessary to proceed with endovaginal exam following the transabdominal exam to visualize the right ovary.  Findings:  Uterus: 9.8 cm length by 4.6 cm AP by 6.2 cm transverse.  Small amount fluid at myotomy site from recent cesarean section.  No focal uterine mass.  Endometrium: 5 mm  diameter, normal.  No endometrial fluid.  Right ovary:  4.8 x 2.3 x 2.4 cm.  Normal morphology without mass.  Left ovary: 3.6 x  2.3 x 2.4 cm.  Normal morphology without mass.  Other findings: Small amount of nonspecific free pelvic fluid.  No adnexal masses identified.  IMPRESSION: Uterine postsurgical changes from cesarean section. Small amount of nonspecific free pelvic fluid. Otherwise negative exam.  Original Report Authenticated By: Lollie Marrow, M.D.   US Pelvis Complete  09/11/2011  *RADIOLOGY REPORT*  Clinical Data: Right pelvic pain, cesarean section on 07/29/2011  TRANSABDOMINAL AND TRANSVAGINAL ULTRASOUND OF PELVIS Technique:  Both transabdominal and transvaginal ultrasound examinations of the pelvis were performed. Transabdominal technique was performed for global imaging of the pelvis including uterus, ovaries, adnexal regions, and pelvic cul-de-sac.  Comparison: OB ultrasound 07/05/2011   It was necessary to proceed with endovaginal exam following the transabdominal exam to visualize the right ovary.  Findings:  Uterus: 9.8 cm length by 4.6 cm AP by 6.2 cm transverse.  Small amount fluid at myotomy site from recent cesarean section.  No focal uterine mass.  Endometrium: 5 mm diameter, normal.  No endometrial fluid.  Right ovary:  4.8 x 2.3 x 2.4 cm.  Normal morphology without mass.  Left ovary: 3.6 x 2.3 x 2.4 cm.  Normal morphology without mass.  Other findings: Small amount of nonspecific free pelvic fluid.  No adnexal masses identified.  IMPRESSION: Uterine postsurgical changes from cesarean section. Small amount of nonspecific free pelvic fluid. Otherwise negative exam.  Original Report Authenticated By: Lollie Marrow, M.D.    Discussed pt with Dr. Tenny Craw. Will give Rx for vicodin and will f/u in office. Assessment and Plan  Abd pain/BV: discussed with pt at length. Will tx with Flagyl. Warned of antabuse reaction. Will also give Rx for vicodin. She will f/u with Dr. Tenny Craw. Discussed diet,  activity, risks, and precautions.  Clinton Gallant. Julene Rahn III, DrHSc, MPAS, PA-C  09/11/2011, 7:56 AM   Henrietta Hoover, PA 09/11/11 [phone]

## 2012-03-10 ENCOUNTER — Ambulatory Visit: Payer: Managed Care, Other (non HMO) | Admitting: Family Medicine

## 2012-03-10 VITALS — BP 119/77 | HR 57 | Temp 98.4°F | Resp 17 | Ht 70.5 in | Wt 213.2 lb

## 2012-03-10 DIAGNOSIS — J029 Acute pharyngitis, unspecified: Secondary | ICD-10-CM

## 2012-03-10 LAB — POCT RAPID STREP A (OFFICE): Rapid Strep A Screen: NEGATIVE

## 2012-03-10 MED ORDER — PENICILLIN G BENZATHINE 1200000 UNIT/2ML IM SUSP
1.2000 10*6.[IU] | Freq: Once | INTRAMUSCULAR | Status: AC
  Administered 2012-03-10: 1.2 10*6.[IU] via INTRAMUSCULAR

## 2012-03-10 NOTE — Progress Notes (Signed)
New Patient Visit:  HPI:  SORE THROAT  Onset: 3 days  Description: sore throat, difficulty swallowing, fever Modifying factors: none  Symptoms  Fever:  yes URI symptoms: no Cough: no Headache: no Rash:  no Swollen glands:   yes Recent Strep Exposure: no LUQ pain: no Heartburn/brash: no Allergy Symptoms: no  Red Flags STD exposure: no Breathing difficulty: no Drooling: no Trismus: no   Patient Active Problem List  Diagnoses  . HYPERLIPIDEMIA  . ASTHMA   Past Medical History: Past Medical History  Diagnosis Date  . Abnormal Pap smear   . Ovarian cyst   . Gonorrhea 2007  . Urinary tract infection   . Asthma     albuterol inhaler    Past Surgical History: Past Surgical History  Procedure Date  . Cesarean section   . Dilation and curettage of uterus   . Mab     x 1 - no surgery required 2009  . Wisdom tooth extraction   . Cesarean section 07/29/2011    Procedure: CESAREAN SECTION;  Surgeon: Almon Hercules;  Location: [facility];  Service: Gynecology;  Laterality: N/A;  Repeat    Social History: History   Social History  . Marital Status: Married    Spouse Name: N/A    Number of Children: N/A  . Years of Education: N/A   Social History Main Topics  . Smoking status: Never Smoker   . Smokeless tobacco: Never Used  . Alcohol Use: No     none since pregnancy  . Drug Use: No  . Sexually Active: Yes    Birth Control/ Protection: None     pregnant   Other Topics Concern  . Not on file   Social History Narrative  . No narrative on file    Family History: Family History  Problem Relation Age of Onset  . Cancer Mother   . Diabetes Mother   . Cancer Paternal Aunt   . Diabetes Maternal Grandfather     Allergies: Allergies  Allergen Reactions  . Aspirin Anaphylaxis    Pt says she can take ibuprofen  . Mushroom Extract Complex Swelling and Rash    Current Outpatient Prescriptions  Medication Sig Dispense Refill  . albuterol (PROVENTIL  HFA;VENTOLIN HFA) 108 (90 BASE) MCG/ACT inhaler Inhale 2 puffs into the lungs every 6 (six) hours as needed. Shortness of breath      . budesonide-formoterol (SYMBICORT) 80-4.5 MCG/ACT inhaler Inhale 2 puffs into the lungs 2 (two) times daily.        . Fenugreek 500 MG CAPS Take 2 capsules by mouth every 8 (eight) hours. To help breastmilk come in       . oxyCODONE-acetaminophen (PERCOCET) 5-325 MG per tablet Take 1-2 tablets by mouth every 8 (eight) hours as needed. For pain       . prenatal vitamin w/FE, FA (PRENATAL 1 + 1) 27-1 MG TABS Take 1 tablet by mouth daily.         Current Facility-Administered Medications  Medication Dose Route Frequency Provider Last Rate Last Dose  . penicillin g benzathine (BICILLIN LA) 1200000 UNIT/2ML injection 1.2 Million Units  1.2 Million Units Intramuscular Once Floydene Flock, MD       Review Of Systems: negative except as noted above in HPI.   Physical Exam: Filed Vitals:   03/10/12 1557  BP: 119/77  Pulse: 57  Temp: 98.4 F (36.9 C)  Resp: 17   General: alert and cooperative HEENT: PERRLA, extra ocular movement intact  and + post oropharyngeal erythema, + tonsillar exudate, + R cervical LAD Heart: S1, S2 normal, no murmur, rub or gallop, regular rate and rhythm Lungs: clear to auscultation, no wheezes or rales and unlabored breathing Abdomen: abdomen is soft without significant tenderness, masses, organomegaly or guarding Extremities: extremities normal, atraumatic, no cyanosis or edema Skin:no rashes Neurology: normal without focal findings   Assessment and Plan:  Strep Throat;  Centor Criteria score of 4.  Will treat with bicillin. Infectious red flags discussed.  Handout given.  Follow up as needed.      The patient and/or caregiver has been counseled thoroughly with regard to treatment plan and/or medications prescribed including dosage, schedule, interactions, rationale for use, and possible side effects and they verbalize  understanding. Diagnoses and expected course of recovery discussed and will return if not improved as expected or if the condition worsens. Patient and/or caregiver verbalized understanding.

## 2012-03-10 NOTE — Patient Instructions (Signed)

## 2012-03-12 NOTE — Progress Notes (Signed)
Reviewed note and agree with plan of care.

## 2012-03-13 ENCOUNTER — Emergency Department (HOSPITAL_COMMUNITY)
Admission: EM | Admit: 2012-03-13 | Discharge: 2012-03-14 | Disposition: A | Discharge status: Home / Self Care | Payer: Managed Care, Other (non HMO) | Attending: Emergency Medicine | Admitting: Emergency Medicine

## 2012-03-13 ENCOUNTER — Encounter (HOSPITAL_COMMUNITY): Payer: Self-pay | Admitting: Emergency Medicine

## 2012-03-13 DIAGNOSIS — J45909 Unspecified asthma, uncomplicated: Secondary | ICD-10-CM | POA: Insufficient documentation

## 2012-03-13 DIAGNOSIS — R509 Fever, unspecified: Secondary | ICD-10-CM | POA: Insufficient documentation

## 2012-03-13 DIAGNOSIS — Z79899 Other long term (current) drug therapy: Secondary | ICD-10-CM | POA: Insufficient documentation

## 2012-03-13 DIAGNOSIS — J029 Acute pharyngitis, unspecified: Secondary | ICD-10-CM | POA: Insufficient documentation

## 2012-03-13 NOTE — ED Notes (Signed)
Pt c/o sore throat x1 week. Pt states she had temp of 103 Thursday so she went to urgent care. Pt was told she had strep and "given a shot in the butt" and that she would be better in 24 hours. Pt states she felt slightly better on Friday but symptoms began to return shortly after.

## 2012-03-14 MED ORDER — AMOXICILLIN 500 MG PO CAPS: 500.0000 mg | ORAL_CAPSULE | Freq: Three times a day (TID) | ORAL | Status: AC

## 2012-03-14 MED ORDER — GI COCKTAIL ~~LOC~~
30.0000 mL | Freq: Once | ORAL | Status: AC
  Administered 2012-03-14: 30 mL via ORAL
  Filled 2012-03-14: qty 30

## 2012-03-14 MED ORDER — CETIRIZINE-PSEUDOEPHEDRINE ER 5-120 MG PO TB12: 1.0000 | ORAL_TABLET | Freq: Every day | ORAL | Status: DC

## 2012-03-14 MED ORDER — GUAIFENESIN 100 MG/5ML PO LIQD: 100.0000 mg | ORAL | Status: AC | PRN

## 2012-03-14 NOTE — ED Provider Notes (Signed)
History     CSN: [id number]  Arrival date & time 03/13/12  2254   First MD Initiated Contact with Patient 03/14/12 0048      Chief Complaint  Patient presents with  . Sore Throat   HPI  History provided by the patient. Patient is a 31 year old female with history of asthma who presents with complaints of persistent sore throat for the past week. Symptoms came on gradually and have been persistent. Pain is described as moderate to severe and worse with swallowing. Patient also reports having some associated fevers at home with temperature of 103. Patient was seen 4 days ago in urgent care Center. Patient states that she was told she may have possible strep throat infection with your some medication in the shot but does not recall what it was called. Patient states she did feel slightly better the next day but now feels much worse. Symptoms have been associated with some chills and body aches as well as slight nasal congestion rhinorrhea. Patient has not taken anything else for her symptoms.    Past Medical History  Diagnosis Date  . Abnormal Pap smear   . Ovarian cyst   . Gonorrhea 2007  . Urinary tract infection   . Asthma     albuterol inhaler    Past Surgical History  Procedure Date  . Cesarean section   . Dilation and curettage of uterus   . Mab     x 1 - no surgery required 2009  . Wisdom tooth extraction   . Cesarean section 07/29/2011    Procedure: CESAREAN SECTION;  Surgeon: Almon Hercules;  Location: [facility];  Service: Gynecology;  Laterality: N/A;  Repeat    Family History  Problem Relation Age of Onset  . Cancer Mother   . Diabetes Mother   . Cancer Paternal Aunt   . Diabetes Maternal Grandfather     History  Substance Use Topics  . Smoking status: Never Smoker   . Smokeless tobacco: Never Used  . Alcohol Use: No     none since pregnancy    OB History    Grav Para Term Preterm Abortions TAB SAB Ect Mult Living   4 2 2  0 2 1 1  0 1 3      Review of  Systems  Constitutional: Positive for fever and chills.  HENT: Positive for congestion, sore throat and rhinorrhea.   Respiratory: Negative for cough.   Gastrointestinal: Negative for nausea, vomiting and abdominal pain.  Musculoskeletal: Negative for myalgias.    Allergies  Aspirin and Mushroom extract complex  Home Medications   Current Outpatient Rx  Name Route Sig Dispense Refill  . ALBUTEROL SULFATE HFA 108 (90 BASE) MCG/ACT IN AERS Inhalation Inhale 2 puffs into the lungs every 6 (six) hours as needed. Shortness of breath    . BUDESONIDE-FORMOTEROL FUMARATE 80-4.5 MCG/ACT IN AERO Inhalation Inhale 2 puffs into the lungs 2 (two) times daily.      Marland Kitchen FENUGREEK 500 MG PO CAPS Oral Take 2 capsules by mouth every 8 (eight) hours. To help breastmilk come in     . OXYCODONE-ACETAMINOPHEN 5-325 MG PO TABS Oral Take 1-2 tablets by mouth every 8 (eight) hours as needed. For pain     . PRENATAL PLUS 27-1 MG PO TABS Oral Take 1 tablet by mouth daily.        BP 109/68  Pulse 71  Temp(Src) 98.4 F (36.9 C) (Oral)  SpO2 100%  LMP 02/20/2012  Physical Exam  Nursing note and vitals reviewed. Constitutional: She is oriented to person, place, and time. She appears well-developed and well-nourished. No distress.  HENT:  Head: Normocephalic and atraumatic.       Pharynx is erythematous. There is mild cobblestoning present. Tonsils normal size without exudate.  Neck: Normal range of motion. Neck supple.       No meningeal sign  Cardiovascular: Normal rate and regular rhythm.   Pulmonary/Chest: Effort normal and breath sounds normal. No respiratory distress. She has no wheezes. She has no rales.  Abdominal: Soft.  Lymphadenopathy:    She has no cervical adenopathy.  Neurological: She is alert and oriented to person, place, and time.  Skin: Skin is warm and dry. No rash noted.  Psychiatric: She has a normal mood and affect. Her behavior is normal.    ED Course  Procedures   Results for  orders placed during the hospital encounter of 03/13/12  RAPID STREP SCREEN      Component Value Range   Streptococcus, Group A Screen (Direct) NEGATIVE  NEGATIVE        1. Pharyngitis       MDM  Patient seen and evaluated. Patient no acute distress.          Angus Seller, [place] 03/14/12 [phone]

## 2012-03-14 NOTE — ED Notes (Addendum)
Pt states she went to sleep and woke up on Monday with a fever of 103. Pt states that she "feels like she swallowed glass." PT has been self-medicating with Theraflu & chloraseptic but it has not helped. Pt went to Urgent Care on Thursday where they swabbed her throat and gave her an antibiotic shot for strep throat. Pain has continued to progress. Pt states she has not had a rash & denies knowledge of any exposure and that her fever "broke yesterday."

## 2012-03-14 NOTE — Discharge Instructions (Signed)
You were seen and evaluated today for your symptoms of sore throat. Your strep throat test was negative today. Your providers today having 2 new prescriptions for medications to help with allergies and drainage in your symptoms. Please take these as prescribed and followup with your primary care provider. Continue drink plenty of fluids to stay hydrated. You may also use over-the-counter throat lozenges for your symptoms of sore throat. If you develop any worsening symptoms, increased pain, swelling of the throat, difficulty breathing or difficulty swallowing return to emergency room.   Pharyngitis, Viral and Bacterial Pharyngitis is soreness (inflammation) or infection of the pharynx. It is also called a sore throat. CAUSES  Most sore throats are caused by viruses and are part of a cold. However, some sore throats are caused by strep and other bacteria. Sore throats can also be caused by post nasal drip from draining sinuses, allergies and sometimes from sleeping with an open mouth. Infectious sore throats can be spread from person to person by coughing, sneezing and sharing cups or eating utensils. TREATMENT  Sore throats that are viral usually last 3-4 days. Viral illness will get better without medications (antibiotics). Strep throat and other bacterial infections will usually begin to get better about 24-48 hours after you begin to take antibiotics. HOME CARE INSTRUCTIONS   If the caregiver feels there is a bacterial infection or if there is a positive strep test, they will prescribe an antibiotic. The full course of antibiotics must be taken. If the full course of antibiotic is not taken, you or your child may become ill again. If you or your child has strep throat and do not finish all of the medication, serious heart or kidney diseases may develop.   Drink enough water and fluids to keep your urine clear or pale yellow.   Only take over-the-counter or prescription medicines for pain,  discomfort or fever as directed by your caregiver.   Get lots of rest.   Gargle with salt water ( tsp. of salt in a glass of water) as often as every 1-2 hours as you need for comfort.   Hard candies may soothe the throat if individual is not at risk for choking. Throat sprays or lozenges may also be used.  SEEK MEDICAL CARE IF:   Large, tender lumps in the neck develop.   A rash develops.   Green, yellow-brown or bloody sputum is coughed up.   Your baby is older than 3 months with a rectal temperature of 100.5 F (38.1 C) or higher for more than 1 day.  SEEK IMMEDIATE MEDICAL CARE IF:   A stiff neck develops.   You or your child are drooling or unable to swallow liquids.   You or your child are vomiting, unable to keep medications or liquids down.   You or your child has severe pain, unrelieved with recommended medications.   You or your child are having difficulty breathing (not due to stuffy nose).   You or your child are unable to fully open your mouth.   You or your child develop redness, swelling, or severe pain anywhere on the neck.   You have a fever.   Your baby is older than 3 months with a rectal temperature of 102 F (38.9 C) or higher.   Your baby is 44 months old or younger with a rectal temperature of 100.4 F (38 C) or higher.  MAKE SURE YOU:   Understand these instructions.   Will watch your condition.  Will get help right away if you are not doing well or get worse.  Document Released: 09/21/2005 Document Revised: 09/10/2011 Document Reviewed: 12/19/2007 [facility] Patient Information 2012 [place], [place].   RESOURCE GUIDE  Chronic Pain Problems: Contact [facility]  [phone] Patients need to be referred by their primary care doctor.  Insufficient Money for Medicine: Contact United Way:  call "211" or [facility] 215-035-6687.  No Primary Care Doctor: - Call [facility] Connect  [phone] - can help you locate a primary  care doctor that  accepts your insurance, provides certain services, etc. - Physician Referral Service- [phone]  Agencies that provide inexpensive medical care: - [facility]  [phone] - [facility]  [phone] - [facility]  [phone] - Women's Clinic  [phone] - Planned Parenthood  [phone] [facility]  [phone]  Medicaid-accepting [facility] Providers: - [facility]- [address]  [phone], Mon-Fri 9am-7pm, Sat 9am-1pm - [facility]- [address] [place], [address] [place]  [phone] - [facility]- [address], [address] [place]  [phone] [facility]- [address]  [phone] - Renaye Rakers- [address] [place], [address], [phone]  Only accepts [place] patients after they have their name  applied to their card  Self Pay (no insurance) in [place]: - Sickle Cell Patients: Dr Willey Blade, [facility]  [address] [place], [phone] - [facility]- [address] [place]  [phone]       [facility] [place]- [address]       -     [facility]- see information above (Speak to [employer] if you do not have insurance)       -  [facility]- [address] [place], [phone]       -  [facility]- [address],  [phone]       -  [facility]- [address] [phone]       -  Dr Julio Sicks-  [address], [place], [phone]       -  [facility]- [address], [phone]       -  [facility]- [address], [phone], also [address] [phone]       -    [facility]- [address] [place], [phone], 1st & 3rd Saturday   every month, 10am-1pm  1) Find a Doctor and Pay Out of Pocket Although you won't have to find out who is covered by your insurance  plan, it is a good idea to ask around and get recommendations. You will then need to call the office and see if the doctor you have chosen will accept you as a new patient and what types of options they offer for patients who are self-pay. Some doctors offer discounts or will set up payment plans for their patients who do not have insurance, but you will need to ask so you aren't surprised when you get to your appointment.  2) Contact Your Local Health Department Not all health departments have doctors that can see patients for sick visits, but many do, so it is worth a call to see if yours does. If you don't know  where your local health department is, you can check in your phone book. The CDC also has a tool to help you locate your state's health department, and many state websites also have listings of all of their local health departments.  3) Find a Walk-in Clinic If your illness is not likely to be very severe or complicated, you may want to try a walk in clinic. These are popping up all over the country in pharmacies, drugstores, and shopping centers. They're usually staffed by nurse practitioners or physician assistants that have been trained to treat common illnesses and complaints. They're usually fairly quick and inexpensive. However, if you have serious medical issues or chronic medical problems, these are probably not your best option  STD Testing - [facility] Department of [facility], STD Clinic, [address] [place], phone [phone] or [phone].  Monday - Friday, call for an appointment. [facility] Department of [employer], STD Clinic, [address], [place], phone [phone] or [phone].  Monday - Friday, call for an appointment.  Abuse/Neglect: [facility] Child Abuse Hotline 908-416-1545 [facility] Child Abuse Hotline (205)226-0817 (After Hours)  Emergency Shelter:  [facility] (386)321-2588  Maternity Homes: - [facility] [place] [facility] (503) [phone] - [facility] [phone]  MRSA Hotline #:   865-674-7021  [facility] Resources  [facility] [place]  United Way [facility] Dept. [address]                 [address]         [address] [place] [address]  Blondell Reveal Phone:  [phone]                                  Phone:  [phone]                   Phone:  [phone]  [facility] Mental Health, [phone] - [facility] - [phone]       -     [facility] in [place], [address]                                  [phone], [facility] Child Abuse Hotline 979-718-0043 or 910-401-9265 (After Hours)   Behavioral Health Services  Substance Abuse Resources: - [facility]  786-376-1709 - [facility] (787)816-6949 - [facility] [place] [phone] Floydene Flock [phone] - Residential & Outpatient Substance Abuse Program  [phone]  Psychological Services: [facility]  [phone] Services  ([phone] - [facility], [phone] [place]. [address], [place], ACCESS LINE: 920-391-3612 or [phone], [email or web address]  Dental Assistance  If unable to pay or uninsured, contact:  [facility] or [facility]  Health Dept. to become qualified for the adult dental clinic.  Patients with Medicaid: [facility] [phone] [address], [phone] [address] [phone]  If unable to pay, or uninsured, contact [facility] (629)413-8536) or [facility] Department [phone] in [place], [phone] in [facility]) to become qualified for the adult dental clinic  Other Low-Cost Community  Dental Services: - [facility]- [address] [place], [place], [place], [address], [phone], [address]. [phone], 2nd and 4th Thursday of the month at 6:30am.  10 clients each day by appointment, can sometimes see walk-in patients if someone does not show for an appointment. [facility]- [address] [place], [place], [address], [phone] - [facility]- [address], [place], [place], [address], [phone] - [place] [facility]- [phone] [facility] Health Department- [phone] [facility] Department- [phone]

## 2012-03-14 NOTE — ED Notes (Signed)
Pt presents with c/o sore throat x 6 days. Was running fevers earlier in the week but since being seen at Urgent care on Thursday and receiving shot in the bottom, patient doesn't know the name, her fevers have subsided but the pain in her throat has persisted and gotten worse. NAD, VSS.

## 2012-03-14 NOTE — ED Provider Notes (Signed)
Medical screening examination/treatment/procedure(s) were performed by non-physician practitioner and as supervising physician I was immediately available for consultation/collaboration.  Olivia Mackie, MD 03/14/12 [phone]

## 2012-10-30 ENCOUNTER — Emergency Department (HOSPITAL_COMMUNITY)
Admission: EM | Admit: 2012-10-30 | Discharge: 2012-10-30 | Disposition: A | Discharge status: Home / Self Care | Payer: 59 | Attending: Emergency Medicine | Admitting: Emergency Medicine

## 2012-10-30 ENCOUNTER — Emergency Department (HOSPITAL_COMMUNITY): Payer: 59

## 2012-10-30 ENCOUNTER — Encounter (HOSPITAL_COMMUNITY): Payer: Self-pay | Admitting: Emergency Medicine

## 2012-10-30 DIAGNOSIS — J45909 Unspecified asthma, uncomplicated: Secondary | ICD-10-CM | POA: Insufficient documentation

## 2012-10-30 DIAGNOSIS — Z8742 Personal history of other diseases of the female genital tract: Secondary | ICD-10-CM | POA: Insufficient documentation

## 2012-10-30 DIAGNOSIS — Z3202 Encounter for pregnancy test, result negative: Secondary | ICD-10-CM | POA: Insufficient documentation

## 2012-10-30 DIAGNOSIS — K8020 Calculus of gallbladder without cholecystitis without obstruction: Secondary | ICD-10-CM | POA: Insufficient documentation

## 2012-10-30 DIAGNOSIS — Z87440 Personal history of urinary (tract) infections: Secondary | ICD-10-CM | POA: Insufficient documentation

## 2012-10-30 LAB — COMPREHENSIVE METABOLIC PANEL
ALT: 12 U/L (ref 0–35)
AST: 16 U/L (ref 0–37)
Albumin: 2.9 g/dL — ABNORMAL LOW (ref 3.5–5.2)
Alkaline Phosphatase: 59 U/L (ref 39–117)
CO2: 22 mEq/L (ref 19–32)
Chloride: 102 mEq/L (ref 96–112)
GFR calc non Af Amer: >90 mL/min (ref >90)
Potassium: 3.4 mEq/L — ABNORMAL LOW (ref 3.5–5.1)
Total Bilirubin: 0.4 mg/dL (ref 0.3–1.2)

## 2012-10-30 LAB — CBC WITH DIFFERENTIAL/PLATELET
Basophils Relative: 0 % (ref 0–1)
Eosinophils Absolute: 0.3 10*3/uL (ref 0.0–0.7)
Hemoglobin: 11.8 g/dL — ABNORMAL LOW (ref 12.0–15.0)
MCH: 28.2 pg (ref 26.0–34.0)
MCHC: 32.9 g/dL (ref 30.0–36.0)
Monocytes Absolute: 0.7 10*3/uL (ref 0.1–1.0)
Monocytes Relative: 7 % (ref 3–12)
Neutrophils Relative %: 63 % (ref 43–77)

## 2012-10-30 LAB — POCT I-STAT, CHEM 8
Chloride: 108 mEq/L (ref 96–112)
Creatinine, Ser: 0.7 mg/dL (ref 0.50–1.10)
Glucose, Bld: 101 mg/dL — ABNORMAL HIGH (ref 70–99)
HCT: 35 % — ABNORMAL LOW (ref 36.0–46.0)
Hemoglobin: 11.9 g/dL — ABNORMAL LOW (ref 12.0–15.0)
Potassium: 3.5 mEq/L (ref 3.5–5.1)
Sodium: 140 mEq/L (ref 135–145)

## 2012-10-30 LAB — URINALYSIS, ROUTINE W REFLEX MICROSCOPIC
Glucose, UA: NEGATIVE mg/dL
Hgb urine dipstick: NEGATIVE
Ketones, ur: NEGATIVE mg/dL
Protein, ur: NEGATIVE mg/dL

## 2012-10-30 MED ORDER — HYDROMORPHONE HCL PF 1 MG/ML IJ SOLN
1.0000 mg | Freq: Once | INTRAMUSCULAR | Status: AC
  Administered 2012-10-30: 1 mg via INTRAVENOUS
  Filled 2012-10-30: qty 1

## 2012-10-30 MED ORDER — ONDANSETRON HCL 4 MG PO TABS: 4.0000 mg | ORAL_TABLET | Freq: Four times a day (QID) | ORAL | Status: DC

## 2012-10-30 MED ORDER — POTASSIUM CHLORIDE CRYS ER 20 MEQ PO TBCR
20.0000 meq | EXTENDED_RELEASE_TABLET | Freq: Once | ORAL | Status: AC
  Administered 2012-10-30: 20 meq via ORAL
  Filled 2012-10-30: qty 1

## 2012-10-30 MED ORDER — ONDANSETRON HCL 4 MG/2ML IJ SOLN
4.0000 mg | Freq: Once | INTRAMUSCULAR | Status: AC
  Administered 2012-10-30: 4 mg via INTRAVENOUS
  Filled 2012-10-30: qty 2

## 2012-10-30 MED ORDER — HYDROCODONE-ACETAMINOPHEN 5-325 MG PO TABS: 2.0000 | ORAL_TABLET | ORAL | Status: DC | PRN

## 2012-10-30 NOTE — ED Notes (Signed)
Dr. Anitra Lauth made aware ok for patient to go home aware of patient vomiting & feeling better since vomiting.

## 2012-10-30 NOTE — ED Provider Notes (Signed)
History     CSN: [id number]  Arrival date & time 10/30/12  0345   First MD Initiated Contact with Patient 10/30/12 0533      Chief Complaint  Patient presents with  . Abdominal Pain    (Consider location/radiation/quality/duration/timing/severity/associated sxs/prior treatment) HPI History per patient. Onset of pain tonight after going to bed. Denies any fatty foods or heavy meals. Pain located right upper quadrant sharp in quality and not radiating. No fevers or chills. Some nausea and vomiting secondary to pain. No blood in emesis. No trauma. No history of same. No known sick contacts. No known history of gallbladder disease. No cough or shortness of breath. No hematuria. No difficulty urinating. Pain is moderate to severe.  Past Medical History  Diagnosis Date  . Abnormal Pap smear   . Ovarian cyst   . Gonorrhea 2007  . Urinary tract infection   . Asthma     albuterol inhaler    Past Surgical History  Procedure Date  . Cesarean section   . Dilation and curettage of uterus   . Mab     x 1 - no surgery required 2009  . Wisdom tooth extraction   . Cesarean section 07/29/2011    Procedure: CESAREAN SECTION;  Surgeon: Almon Hercules;  Location: [facility];  Service: Gynecology;  Laterality: N/A;  Repeat    Family History  Problem Relation Age of Onset  . Cancer Mother   . Diabetes Mother   . Cancer Paternal Aunt   . Diabetes Maternal Grandfather     History  Substance Use Topics  . Smoking status: Never Smoker   . Smokeless tobacco: Never Used  . Alcohol Use: No     Comment: none since pregnancy    OB History    Grav Para Term Preterm Abortions TAB SAB Ect Mult Living   4 2 2  0 2 1 1  0 1 3      Review of Systems  Constitutional: Negative for fever and chills.  HENT: Negative for neck pain and neck stiffness.   Eyes: Negative for pain.  Respiratory: Negative for shortness of breath.   Cardiovascular: Negative for chest pain.  Gastrointestinal: Positive for  abdominal pain.  Genitourinary: Negative for dysuria.  Musculoskeletal: Negative for back pain.  Skin: Negative for rash.  Neurological: Negative for headaches.  All other systems reviewed and are negative.    Allergies  Aspirin and Mushroom extract complex  Home Medications   Current Outpatient Rx  Name  Route  Sig  Dispense  Refill  . IBUPROFEN 200 MG PO TABS   Oral   Take 400 mg by mouth every 6 (six) hours as needed. For pain         . NORGESTIM-ETH ESTRAD TRIPHASIC 0.18/0.215/0.25 MG-25 MCG PO TABS   Oral   Take 1 tablet by mouth every morning.           BP 104/67  Pulse 67  Temp 97.9 F (36.6 C) (Oral)  Resp 17  SpO2 98%  LMP 10/11/2012  Physical Exam  Constitutional: She is oriented to person, place, and time. She appears well-developed and well-nourished.  HENT:  Head: Normocephalic and atraumatic.  Eyes: Conjunctivae normal and EOM are normal. Pupils are equal, round, and reactive to light.  Neck: Full passive range of motion without pain. Neck supple.  Cardiovascular: Normal rate, regular rhythm, S1 normal, S2 normal and intact distal pulses.   Pulmonary/Chest: Effort normal and breath sounds normal. She exhibits no tenderness.  Abdominal: Soft. Bowel sounds are normal. She exhibits no distension. There is no CVA tenderness.       Tender to palpation right upper quadrant with voluntary guarding. Negative Murphy sign. No CVA tenderness  Musculoskeletal: Normal range of motion.  Neurological: She is alert and oriented to person, place, and time. No cranial nerve deficit.       No focal deficits.  Skin: Skin is warm and dry. No rash noted. No cyanosis. Nails show no clubbing.  Psychiatric: She has a normal mood and affect. Her speech is normal and behavior is normal.    ED Course  Procedures (including critical care time)  Results for orders placed during the hospital encounter of 10/30/12  CBC WITH DIFFERENTIAL      Component Value Range   WBC 9.5   4.0 - 10.5 K/uL   RBC 4.19  3.87 - 5.11 MIL/uL   Hemoglobin 11.8 (*) 12.0 - 15.0 g/dL   HCT 40.9 (*) 81.1 - 91.4 %   MCV 85.7  78.0 - 100.0 fL   MCH 28.2  26.0 - 34.0 pg   MCHC 32.9  30.0 - 36.0 g/dL   RDW 78.2  95.6 - 21.3 %   Platelets 214  150 - 400 K/uL   Neutrophils Relative 63  43 - 77 %   Neutro Abs 6.0  1.7 - 7.7 K/uL   Lymphocytes Relative 27  12 - 46 %   Lymphs Abs 2.5  0.7 - 4.0 K/uL   Monocytes Relative 7  3 - 12 %   Monocytes Absolute 0.7  0.1 - 1.0 K/uL   Eosinophils Relative 3  0 - 5 %   Eosinophils Absolute 0.3  0.0 - 0.7 K/uL   Basophils Relative 0  0 - 1 %   Basophils Absolute 0.0  0.0 - 0.1 K/uL  LIPASE, BLOOD      Component Value Range   Lipase 33  11 - 59 U/L  URINALYSIS, ROUTINE W REFLEX MICROSCOPIC      Component Value Range   Color, Urine AMBER (*) YELLOW   APPearance CLOUDY (*) CLEAR   Specific Gravity, Urine 1.030  1.005 - 1.030   pH 6.0  5.0 - 8.0   Glucose, UA NEGATIVE  NEGATIVE mg/dL   Hgb urine dipstick NEGATIVE  NEGATIVE   Bilirubin Urine SMALL (*) NEGATIVE   Ketones, ur NEGATIVE  NEGATIVE mg/dL   Protein, ur NEGATIVE  NEGATIVE mg/dL   Urobilinogen, UA 1.0  0.0 - 1.0 mg/dL   Nitrite NEGATIVE  NEGATIVE   Leukocytes, UA TRACE (*) NEGATIVE  PREGNANCY, URINE      Component Value Range   Preg Test, Ur NEGATIVE  NEGATIVE  POCT I-STAT, CHEM 8      Component Value Range   Sodium 140  135 - 145 mEq/L   Potassium 3.5  3.5 - 5.1 mEq/L   Chloride 108  96 - 112 mEq/L   BUN 14  6 - 23 mg/dL   Creatinine, Ser 0.86  0.50 - 1.10 mg/dL   Glucose, Bld 578 (*) 70 - 99 mg/dL   Calcium, Ion 4.69  1.12 - 1.23 mmol/L   TCO2 23  0 - 100 mmol/L   Hemoglobin 11.9 (*) 12.0 - 15.0 g/dL   HCT 62.9 (*) 52.8 - 41.3 %  COMPREHENSIVE METABOLIC PANEL      Component Value Range   Sodium 133 (*) 135 - 145 mEq/L   Potassium 3.4 (*) 3.5 - 5.1 mEq/L  Chloride 102  96 - 112 mEq/L   CO2 22  19 - 32 mEq/L   Glucose, Bld 102 (*) 70 - 99 mg/dL   BUN 15  6 - 23 mg/dL    Creatinine, Ser 1.61  0.50 - 1.10 mg/dL   Calcium 8.2 (*) 8.4 - 10.5 mg/dL   Total Protein 6.4  6.0 - 8.3 g/dL   Albumin 2.9 (*) 3.5 - 5.2 g/dL   AST 16  0 - 37 U/L   ALT 12  0 - 35 U/L   Alkaline Phosphatase 59  39 - 117 U/L   Total Bilirubin 0.4  0.3 - 1.2 mg/dL   GFR calc non Af Amer >90  >90 mL/min   GFR calc Af Amer >90  >90 mL/min  URINE MICROSCOPIC-ADD ON      Component Value Range   Squamous Epithelial / LPF RARE  RARE   WBC, UA 0-2  <3 WBC/hpf   Bacteria, UA FEW (*) RARE   US Abdomen Complete  10/30/2012  *RADIOLOGY REPORT*  Clinical Data:  Abdominal pain.  ABDOMINAL ULTRASOUND COMPLETE  Comparison:  Abdominal ultrasound performed 05/29/2009  Findings:  Gallbladder:  Multiple mobile stones are noted within the gallbladder, with sludge seen at the fundus of the gallbladder. There is question of mild gallbladder wall thickening.  No pericholecystic fluid is identified.  However, a positive ultrasonographic Murphy's sign is elicited.  The gallbladder appears mildly distended.  Common Bile Duct:  0.4 cm in diameter; within normal limits in caliber.  Liver:  Mildly heterogeneous echogenicity and coarsened echotexture, likely reflecting mild fatty infiltration; no focal lesions identified.  Limited Doppler evaluation demonstrates normal blood flow within the liver.  IVC:  Unremarkable in appearance.  Not well characterized inferiorly.  Pancreas:  Although the pancreas is difficult to visualize in its entirety due to overlying bowel gas, no focal pancreatic abnormality is identified.  Spleen:  10.3 cm in length; within normal limits in size and echotexture.  Right kidney:  12.6 cm in length; normal in size, configuration and parenchymal echogenicity.  No evidence of mass or hydronephrosis.  Left kidney:  11.2 cm in length; normal in size, configuration and parenchymal echogenicity.  No evidence of mass or hydronephrosis.  Abdominal Aorta:  Normal in caliber; no aneurysm identified.  Not well  characterized distally due to overlying bowel gas.  IMPRESSION:  1.  Multiple mobile stone within the gallbladder, with slight noted at the fundus of the gallbladder.  Question of mild gallbladder wall thickening.  No pericholecystic fluid seen.  However, a positive ultrasonographic Murphy's sign is elicited.  This is of uncertain significance; no definite findings are seen to suggest acute cholecystitis.  The gallbladder appears mildly distended; this could reflect a non- visualized stone lodged at the neck of the gallbladder.  2.  Suggestion of mild fatty infiltration within the liver.   Original Report Authenticated By: Tonia Ghent, M.D.    IV Dilaudid. IV Zofran.   7:39 AM pain resolved, tolerates PO fluids, stable for d/c home. Ultrasound results shared with patient and plan followup with general surgery. No indication for admit at this time good symptom control and clinically negative Murphy's sign without leukocytosis or elevated LFTs.  MDM  Right upper quadrant abdominal pain with gallstones, evaluated with ultrasound and labs reviewed as above. Improved with IV narcotics. Vital signs and nursing notes reviewed.        Sunnie Nielsen, MD 11/01/12 [phone]

## 2012-10-30 NOTE — ED Notes (Signed)
Pt. Reminded for urinalysis, verbalized understanding . 

## 2012-10-30 NOTE — ED Notes (Signed)
Per EMs , pt.is from home with complaint of abdominal pain@ 10/10. No N/V , no diarrhea. Pain started  Yesterday whole day and got worsen at midnight.

## 2012-10-30 NOTE — ED Notes (Signed)
ZOX:[id number]<AV> Expected date:<BR> Expected time:<BR> Means of arrival:<BR> Comments:<BR> ems

## 2012-11-07 ENCOUNTER — Ambulatory Visit (INDEPENDENT_AMBULATORY_CARE_PROVIDER_SITE_OTHER): Payer: 59 | Admitting: General Surgery

## 2012-11-07 ENCOUNTER — Telehealth (INDEPENDENT_AMBULATORY_CARE_PROVIDER_SITE_OTHER): Payer: Self-pay | Admitting: General Surgery

## 2012-11-07 ENCOUNTER — Encounter (INDEPENDENT_AMBULATORY_CARE_PROVIDER_SITE_OTHER): Payer: Self-pay | Admitting: General Surgery

## 2012-11-07 VITALS — BP 130/64 | HR 76 | Temp 97.4°F | Resp 16 | Ht 72.0 in | Wt 205.0 lb

## 2012-11-07 DIAGNOSIS — K8020 Calculus of gallbladder without cholecystitis without obstruction: Secondary | ICD-10-CM

## 2012-11-07 MED ORDER — ONDANSETRON HCL 4 MG PO TABS: 4.0000 mg | ORAL_TABLET | Freq: Four times a day (QID) | ORAL | Status: DC

## 2012-11-07 MED ORDER — HYDROCODONE-ACETAMINOPHEN 5-325 MG PO TABS: 1.0000 | ORAL_TABLET | Freq: Four times a day (QID) | ORAL | Status: DC | PRN

## 2012-11-07 NOTE — Telephone Encounter (Signed)
Norco and Zofran called to patient's pharmacy.

## 2012-11-07 NOTE — Patient Instructions (Signed)

## 2012-11-07 NOTE — Progress Notes (Signed)
Patient ID: Nichole Kirk, female   DOB: [birth year], 32 y.o.   MRN: [id number]  Chief Complaint  Patient presents with  . New Evaluation    eval for cholelithiasis    HPI Nichole Kirk is a 32 y.o. female.   HPI 32 yo AAF referred by Dr Theodoro Kalata for gallstones. The patient states about 1-1/2 weeks ago she developed right upper quadrant pain. She states that it happened weekend before last when she developed some pain in her upper abdomen that lasted all day. She went to bed but awoke in the night with severe right upper quadrant pain associated with nausea. She states it is 10 out of 10. She felt like somebody was stabbing her in her right upper abdomen. She was taken by EMS to the emergency department. She had an ultrasound and lab work performed. She was found to have gallstones as well as sludge in the gallbladder fundus. She was given a prescription for pain medication as well as antimanic medication and discharged home. She states over the past several months she has lost about 30 pounds after her recent pregnancy. This has been a planned weight loss. She states that she still having daily right-sided abdominal pain that was radiating to her back. Right now the pain is about a 4/10. She states that she's also been having some loose stools the past several days. She denies any recent vomiting, Fever or chills. She denies any melena or hematochezia. She denies any NSAID use. She denies any difficulty swallowing. She denies any jaundice. Past Medical History  Diagnosis Date  . Abnormal Pap smear   . Ovarian cyst   . Gonorrhea 2007  . Urinary tract infection   . Asthma     albuterol inhaler    Past Surgical History  Procedure Date  . Cesarean section   . Dilation and curettage of uterus   . Mab     x 1 - no surgery required 2009  . Wisdom tooth extraction   . Cesarean section 07/29/2011    Procedure: CESAREAN SECTION;  Surgeon: Almon Hercules;  Location: [facility];  Service: Gynecology;   Laterality: N/A;  Repeat    Family History  Problem Relation Age of Onset  . Cancer Mother     Breast  . Diabetes Mother   . Cancer Paternal Aunt     breast  . Diabetes Maternal Grandfather   . Cancer Maternal Grandmother     Breast  . Cancer Paternal Aunt     breast  . Cancer Paternal Aunt     ovarian    Social History History  Substance Use Topics  . Smoking status: Never Smoker   . Smokeless tobacco: Never Used  . Alcohol Use: Yes     Comment: socially    Allergies  Allergen Reactions  . Aspirin Anaphylaxis    Pt says she can take ibuprofen  . Mushroom Extract Complex Swelling and Rash    Current Outpatient Prescriptions  Medication Sig Dispense Refill  . clindamycin (CLEOCIN) 150 MG capsule       . diazepam (VALIUM) 5 MG tablet Take 5 mg by mouth every 6 (six) hours as needed. Pt unsure of actual dosage      . HYDROcodone-acetaminophen (NORCO/VICODIN) 5-325 MG per tablet Take 1-2 tablets by mouth every 6 (six) hours as needed for pain.  15 tablet  0  . ibuprofen (ADVIL,MOTRIN) 200 MG tablet Take 400 mg by mouth every 6 (six) hours as needed.  For pain      . Norgestimate-Ethinyl Estradiol Triphasic (ORTHO TRI-CYCLEN LO) 0.18/0.215/0.25 MG-25 MCG tab Take 1 tablet by mouth every morning.      . ondansetron (ZOFRAN) 4 MG tablet Take 1 tablet (4 mg total) by mouth every 6 (six) hours.  12 tablet  0    Review of Systems Review of Systems  Constitutional: Negative for fever, chills and unexpected weight change.  HENT: Negative for hearing loss, congestion, sore throat, trouble swallowing and voice change.   Eyes: Negative for visual disturbance.  Respiratory: Negative for cough and wheezing.   Cardiovascular: Negative for chest pain, palpitations and leg swelling.  Gastrointestinal: Positive for abdominal pain and diarrhea. Negative for nausea, vomiting, constipation, blood in stool, abdominal distention and anal bleeding.  Genitourinary: Negative for hematuria,  vaginal bleeding and difficulty urinating.  Musculoskeletal: Negative for arthralgias.  Skin: Negative for rash and wound.  Neurological: Negative for seizures, syncope and headaches.  Hematological: Negative for adenopathy. Does not bruise/bleed easily.  Psychiatric/Behavioral: Negative for confusion.    Blood pressure 130/64, pulse 76, temperature 97.4 F (36.3 C), temperature source Temporal, resp. rate 16, height 6' (1.829 m), weight 205 lb (92.987 kg), last menstrual period 10/11/2012.  Physical Exam Physical Exam  Vitals reviewed. Constitutional: She is oriented to person, place, and time. She appears well-developed and well-nourished. No distress.  HENT:  Head: Normocephalic and atraumatic.  Right Ear: External ear normal.  Left Ear: External ear normal.  Eyes: Conjunctivae normal are normal. No scleral icterus.  Neck: Normal range of motion. No tracheal deviation present. No thyromegaly present.  Cardiovascular: Normal rate, regular rhythm and normal heart sounds.   Pulmonary/Chest: Effort normal and breath sounds normal. No respiratory distress. She has no wheezes.  Abdominal: Soft. She exhibits no distension. There is no tenderness. There is no rebound and no guarding.  Musculoskeletal: She exhibits no edema and no tenderness.  Lymphadenopathy:    She has no cervical adenopathy.  Neurological: She is alert and oriented to person, place, and time.  Skin: Skin is warm and dry. No rash noted. She is not diaphoretic. No erythema. No pallor.  Psychiatric: She has a normal mood and affect. Her behavior is normal. Judgment and thought content normal.    Data Reviewed ED note Labs from er visit - lfts normal. Some mild electrolyte abnormalities and mild anemia ABDOMINAL ULTRASOUND COMPLETE  Comparison: Abdominal ultrasound performed 05/29/2009  Findings:  Gallbladder: Multiple mobile stones are noted within the  gallbladder, with sludge seen at the fundus of the gallbladder.   There is question of mild gallbladder wall thickening. No  pericholecystic fluid is identified. However, a positive  ultrasonographic Murphy's sign is elicited. The gallbladder  appears mildly distended.  Common Bile Duct: 0.4 cm in diameter; within normal limits in  caliber.  Liver: Mildly heterogeneous echogenicity and coarsened  echotexture, likely reflecting mild fatty infiltration; no focal  lesions identified. Limited Doppler evaluation demonstrates normal  blood flow within the liver.  IVC: Unremarkable in appearance. Not well characterized  inferiorly.  Pancreas: Although the pancreas is difficult to visualize in its  entirety due to overlying bowel gas, no focal pancreatic  abnormality is identified.  Spleen: 10.3 cm in length; within normal limits in size and  echotexture.  Right kidney: 12.6 cm in length; normal in size, configuration and  parenchymal echogenicity. No evidence of mass or hydronephrosis.  Left kidney: 11.2 cm in length; normal in size, configuration and  parenchymal echogenicity. No evidence of  mass or hydronephrosis.  Abdominal Aorta: Normal in caliber; no aneurysm identified. Not  well characterized distally due to overlying bowel gas.   IMPRESSION:  1. Multiple mobile stone within the gallbladder, with slight noted  at the fundus of the gallbladder. Question of mild gallbladder  wall thickening. No pericholecystic fluid seen. However, a  positive ultrasonographic Murphy's sign is elicited. This is of  uncertain significance; no definite findings are seen to suggest  acute cholecystitis.  The gallbladder appears mildly distended; this could reflect a non-  visualized stone lodged at the neck of the gallbladder.  2. Suggestion of mild fatty infiltration within the liver.   Assessment    Symptomatic cholelithiasis    Plan    I believe the patient's symptoms are consistent with gallbladder disease.  We discussed gallbladder disease. The patient  was given Agricultural engineer. We discussed non-operative and operative management. We discussed the signs & symptoms of acute cholecystitis  I discussed laparoscopic cholecystectomy with IOC in detail.  The patient was given educational material as well as diagrams detailing the procedure.  We discussed the risks and benefits of a laparoscopic cholecystectomy including, but not limited to bleeding, infection, injury to surrounding structures such as the intestine or liver, bile leak, retained gallstones, need to convert to an open procedure, prolonged diarrhea, blood clots such as  DVT, common bile duct injury, anesthesia risks, and possible need for additional procedures.  We discussed the typical post-operative recovery course. I explained that the likelihood of improvement of their symptoms is good.  She was given a prescription for pain medication as well as nausea medicine. I advised her that she should limit the amount of pain medication she is taking at a time. She will be scheduled for laparoscopic cholecystectomy soon  Mary Sella. Andrey Campanile, MD, FACS General, Bariatric, & Minimally Invasive Surgery [facility], [place]        [facility] M 11/07/2012, 1:38 PM

## 2012-11-14 ENCOUNTER — Other Ambulatory Visit (INDEPENDENT_AMBULATORY_CARE_PROVIDER_SITE_OTHER): Payer: Self-pay | Admitting: General Surgery

## 2012-11-14 DIAGNOSIS — K8010 Calculus of gallbladder with chronic cholecystitis without obstruction: Secondary | ICD-10-CM

## 2012-11-22 ENCOUNTER — Telehealth (HOSPITAL_COMMUNITY): Payer: Self-pay | Admitting: *Deleted

## 2012-11-29 ENCOUNTER — Ambulatory Visit (INDEPENDENT_AMBULATORY_CARE_PROVIDER_SITE_OTHER): Payer: 59 | Admitting: Internal Medicine

## 2012-11-29 VITALS — BP 92/60 | HR 82 | Temp 98.0°F | Resp 17 | Ht 72.0 in | Wt 208.0 lb

## 2012-11-29 DIAGNOSIS — Z0000 Encounter for general adult medical examination without abnormal findings: Secondary | ICD-10-CM

## 2012-11-29 DIAGNOSIS — Z3002 Counseling and instruction in natural family planning to avoid pregnancy: Secondary | ICD-10-CM

## 2012-11-29 DIAGNOSIS — Z124 Encounter for screening for malignant neoplasm of cervix: Secondary | ICD-10-CM

## 2012-11-29 MED ORDER — MEDROXYPROGESTERONE ACETATE 150 MG/ML IM SUSP
150.0000 mg | Freq: Once | INTRAMUSCULAR | Status: AC
  Administered 2012-11-29: 150 mg via INTRAMUSCULAR

## 2012-11-29 NOTE — Progress Notes (Signed)
  Subjective:    Patient ID: Nichole Kirk, female    DOB: 06/25/1981, 32 y.o.   MRN: [id number]  HPI here for an annual exam and to begin contraception 2 weeks ago had urgent surgery for cholelithiasis/has done well in the aftermath and is ready to resume intercourse Married/twins 3 and new baby 1 Was using oral contraceptives  until surgery-unable to take regularly Last menstrual period 11/09/2012/not sexually active since then  Past medical history hyperlipidemia Asthma Symbicort daily with good protection Primary care Dr. Parke Simmers  Family history- Breast cancer/has regular mammograms  Review of Systems  Constitutional: Negative for fever, activity change, appetite change, fatigue and unexpected weight change.  HENT: Negative for hearing loss, congestion, rhinorrhea, trouble swallowing and neck pain.   Eyes: Negative for discharge and visual disturbance.  Respiratory: Negative for shortness of breath.   Cardiovascular: Negative for chest pain, palpitations and leg swelling.  Gastrointestinal: Negative for nausea, vomiting, abdominal pain, diarrhea and constipation.  Endocrine: Negative for polyuria.  Genitourinary: Negative for dysuria, urgency, frequency, vaginal bleeding, vaginal discharge, difficulty urinating, vaginal pain, menstrual problem and pelvic pain.  Musculoskeletal: Negative for myalgias, back pain and joint swelling.  Skin: Negative for rash.  Allergic/Immunologic: Negative for immunocompromised state.  Neurological: Negative for headaches.  Hematological: Does not bruise/bleed easily.  Psychiatric/Behavioral: Negative for sleep disturbance and dysphoric mood. The patient is not nervous/anxious.        Objective:   Physical Exam BP 92/60  Pulse 82  Temp(Src) 98 F (36.7 C) (Oral)  Resp 17  Ht 6' (1.829 m)  Wt 208 lb (94.348 kg)  BMI 28.2 kg/m2  SpO2 99%  LMP 11/11/2012  Breastfeeding? No HEENT clear without thyromegaly or lymphadenopathy Heart regular  without murmur Lungs clear Breasts symmetrical with no lumps or masses/no nipple discharge Abdomen-healed scars/nontender/no organomegaly or masses Introitus clear/os clear/uterus mid position nontender/no adnexal masses Extremities without edema/good peripheral pulses Neurological intact Psychiatric contact       Assessment & Plan:  Annual exam  Okay to start Depo-Provera every 3 months Pap #3 Declines lab work at this point [employer] up-to-date

## 2012-11-30 ENCOUNTER — Ambulatory Visit (INDEPENDENT_AMBULATORY_CARE_PROVIDER_SITE_OTHER): Payer: 59 | Admitting: General Surgery

## 2012-11-30 ENCOUNTER — Encounter (INDEPENDENT_AMBULATORY_CARE_PROVIDER_SITE_OTHER): Payer: Self-pay | Admitting: General Surgery

## 2012-11-30 VITALS — BP 100/62 | HR 68 | Temp 97.3°F | Resp 16 | Ht 72.0 in | Wt 205.8 lb

## 2012-11-30 DIAGNOSIS — Z09 Encounter for follow-up examination after completed treatment for conditions other than malignant neoplasm: Secondary | ICD-10-CM

## 2012-11-30 NOTE — Progress Notes (Signed)
Subjective:     Patient ID: Nichole Kirk, female   DOB: 09-Jun-1981, 32 y.o.   MRN: [id number]  HPI 32 year old [place] American female comes in for followup after undergoing laparoscopic cholecystectomy with cholangiogram on February 10 at the surgical [facility]. She states she is in well since surgery. She denies any fever, chills, nausea, vomiting. She denies any abdominal pain. She denies any constipation. She has had 2 episodes of loose stools since surgery. They have generally occurred about 30 minutes after eating.   Review of Systems     Objective:   Physical Exam BP 100/62  Pulse 68  Temp(Src) 97.3 F (36.3 C) (Temporal)  Resp 16  Ht 6' (1.829 m)  Wt 205 lb 12.8 oz (93.35 kg)  BMI 27.91 kg/m2  LMP 11/11/2012 Alert,nad Soft, nt, nd. Incisions c/d/i No scleral icterus    Assessment:     Status post laparoscopic cholecystectomy with cholangiogram for chronic cholecystitis and cholelithiasis     Plan:     She appears to be doing well from surgery. We reviewed her pathology. She was given a copy of her pathology report. I explained that the 2 episodes of diarrhea should improve further she gets out from surgery. I encouraged her to contact our office in 6 weeks should she have ongoing episodes of intermittent diarrhea. I have released her to full activities. Followup as needed  Mary Sella. Andrey Campanile, MD, FACS General, Bariatric, & Minimally Invasive Surgery [facility], [place]

## 2012-11-30 NOTE — Patient Instructions (Signed)
Can resume full activities 

## 2012-12-02 LAB — PAP IG, CT-NG, RFX HPV ASCU
Chlamydia Probe Amp: NEGATIVE
GC Probe Amp: NEGATIVE

## 2012-12-03 ENCOUNTER — Encounter: Payer: Self-pay | Admitting: Internal Medicine

## 2013-01-01 ENCOUNTER — Telehealth: Payer: Self-pay | Admitting: Internal Medicine

## 2013-01-01 NOTE — Telephone Encounter (Signed)
Returned call [phone] 8:50 pm 01/01/13 Vaginal Bleeding for the past 7 days --restarted Depo in February following cholecystectomy Now 1 pad per 1-2 hrs No cramps/neg GU/No nausea Has had 2-4 loose BMs daily for this past week Can't tell if any blood in stool cause of the vag bleeding No fever, bruising, bleeding gums  Advised eval in clinic w/CBC at least before treatment  ?etio of diarrhea and bleeding

## 2013-01-05 ENCOUNTER — Ambulatory Visit (INDEPENDENT_AMBULATORY_CARE_PROVIDER_SITE_OTHER): Payer: 59 | Admitting: Physician Assistant

## 2013-01-05 VITALS — BP 104/70 | HR 83 | Temp 98.5°F | Resp 18 | Ht 71.5 in | Wt 208.0 lb

## 2013-01-05 DIAGNOSIS — R195 Other fecal abnormalities: Secondary | ICD-10-CM

## 2013-01-05 DIAGNOSIS — N920 Excessive and frequent menstruation with regular cycle: Secondary | ICD-10-CM

## 2013-01-05 LAB — POCT CBC
Granulocyte percent: 68.8 %G (ref 37–80)
HCT, POC: 40.8 % (ref 37.7–47.9)
Hemoglobin: 12.8 g/dL (ref 12.2–16.2)
MCV: 88.6 fL (ref 80–97)
MID (cbc): 0.6 (ref 0–0.9)
Platelet Count, POC: 274 10*3/uL (ref 142–424)
RBC: 4.6 M/uL (ref 4.04–5.48)
WBC: 12 10*3/uL — AB (ref 4.6–10.2)

## 2013-01-05 LAB — POCT SEDIMENTATION RATE: POCT SED RATE: 30 mm/hr — AB (ref 0–22)

## 2013-01-05 NOTE — Progress Notes (Signed)
Subjective:    Patient ID: Nichole Kirk, female    DOB: [birth year], 32 y.o.   MRN: [id number]  HPI 32 yr old AAF presents with a 12 d h/o vaginal bleeding.  She started depo provera at the end of February and started having bleeding about the time her next period was due, but it hasn't subsided.  It has gotten lighter over the last few days. She was on Depo Provera about 6 years ago and did fine with it without having menstruation.  She is very frustrated with the bleeding and wants it to stop.  She doesn't have sex when she is having bleeding. She denies any unusual cramping or vaginal discharge. She denies pelvic pain or STD risk factors. She has a banquet next week and will be wearing a white mermaid-fitted type dress and doesn't want to still be having bleeding/spotting.  Also, she c/o looser stools than normal.  She had a cholecystectomy 11/14/2012 without sequelae.  She noticed the change in her bowels a few weeks after that.  She does think it occurs more after she eats a fatty meal. She denies mucus or blood in her stools.  She hasn't travelled anywhere other than [place], [place] and drinks only bottled water.  No well water exposure.  No unusual foods. No f/c.  Stools are loose but not watery. She denies abdominal pain.   Review of Systems  All other systems reviewed and are negative.       Objective:   Physical Exam  Nursing note and vitals reviewed. Constitutional: She is oriented to person, place, and time. She appears well-developed and well-nourished.  HENT:  Head: Normocephalic and atraumatic.  Cardiovascular: Normal rate, regular rhythm and normal heart sounds.   Pulmonary/Chest: Effort normal and breath sounds normal.  Abdominal: Soft. Bowel sounds are normal. She exhibits no distension. There is no tenderness.  Neurological: She is alert and oriented to person, place, and time.  Skin: Skin is warm and dry.  Psychiatric: She has a normal mood and affect. Her behavior is  normal.   Results for orders placed in visit on 01/05/13  POCT CBC      Result Value Range   WBC 12.0 (*) 4.6 - 10.2 K/uL   Lymph, poc 3.1  0.6 - 3.4   POC LYMPH PERCENT 26.0  10 - 50 %L   MID (cbc) 0.6  0 - 0.9   POC MID % 5.2  0 - 12 %M   POC Granulocyte 8.3 (*) 2 - 6.9   Granulocyte percent 68.8  37 - 80 %G   RBC 4.60  4.04 - 5.48 M/uL   Hemoglobin 12.8  12.2 - 16.2 g/dL   HCT, POC 47.8  29.5 - 47.9 %   MCV 88.6  80 - 97 fL   MCH, POC 27.8  27 - 31.2 pg   MCHC 31.4 (*) 31.8 - 35.4 g/dL   RDW, POC 62.1     Platelet Count, POC 274  142 - 424 K/uL   MPV 8.8  0 - 99.8 fL          Assessment & Plan:  Longer than normal menstruation after restarting depo. This is normal and her bleeding is not an excessive amount, but I will make sure there is no evidence of anemia.  Explained typical R/B of Depo and that it may take a few injections for her periods to lighten/go away. Bowel change-likely secondary to stool changes s/p cholecystectomy.  She will watch  this more closely as it relates to the foods she is taking in.  We will check some baseline labs tonight.  If those are normal, watchful waiting and RTC if symptoms worsen or change for further work-up.

## 2013-01-06 LAB — COMPREHENSIVE METABOLIC PANEL
AST: 21 U/L (ref 0–37)
Albumin: 4.3 g/dL (ref 3.5–5.2)
BUN: 10 mg/dL (ref 6–23)
CO2: 24 mEq/L (ref 19–32)
Calcium: 9 mg/dL (ref 8.4–10.5)
Chloride: 107 mEq/L (ref 96–112)
Potassium: 4.2 mEq/L (ref 3.5–5.3)

## 2013-01-11 ENCOUNTER — Encounter: Payer: Self-pay | Admitting: Family Medicine

## 2013-05-16 ENCOUNTER — Encounter (HOSPITAL_COMMUNITY): Payer: Self-pay | Admitting: *Deleted

## 2013-05-16 ENCOUNTER — Emergency Department (HOSPITAL_COMMUNITY)
Admission: EM | Admit: 2013-05-16 | Discharge: 2013-05-17 | Disposition: A | Discharge status: Home / Self Care | Payer: 59 | Attending: Emergency Medicine | Admitting: Emergency Medicine

## 2013-05-16 DIAGNOSIS — Z8742 Personal history of other diseases of the female genital tract: Secondary | ICD-10-CM | POA: Insufficient documentation

## 2013-05-16 DIAGNOSIS — Z87440 Personal history of urinary (tract) infections: Secondary | ICD-10-CM | POA: Insufficient documentation

## 2013-05-16 DIAGNOSIS — R358 Other polyuria: Secondary | ICD-10-CM | POA: Insufficient documentation

## 2013-05-16 DIAGNOSIS — J45909 Unspecified asthma, uncomplicated: Secondary | ICD-10-CM | POA: Insufficient documentation

## 2013-05-16 DIAGNOSIS — R300 Dysuria: Secondary | ICD-10-CM | POA: Insufficient documentation

## 2013-05-16 DIAGNOSIS — R509 Fever, unspecified: Secondary | ICD-10-CM | POA: Insufficient documentation

## 2013-05-16 DIAGNOSIS — Z9889 Other specified postprocedural states: Secondary | ICD-10-CM | POA: Insufficient documentation

## 2013-05-16 DIAGNOSIS — Z8619 Personal history of other infectious and parasitic diseases: Secondary | ICD-10-CM | POA: Insufficient documentation

## 2013-05-16 DIAGNOSIS — N12 Tubulo-interstitial nephritis, not specified as acute or chronic: Secondary | ICD-10-CM | POA: Insufficient documentation

## 2013-05-16 DIAGNOSIS — IMO0001 Reserved for inherently not codable concepts without codable children: Secondary | ICD-10-CM | POA: Insufficient documentation

## 2013-05-16 DIAGNOSIS — R3589 Other polyuria: Secondary | ICD-10-CM | POA: Insufficient documentation

## 2013-05-16 DIAGNOSIS — Z3202 Encounter for pregnancy test, result negative: Secondary | ICD-10-CM | POA: Insufficient documentation

## 2013-05-16 DIAGNOSIS — Z8659 Personal history of other mental and behavioral disorders: Secondary | ICD-10-CM | POA: Insufficient documentation

## 2013-05-16 DIAGNOSIS — M549 Dorsalgia, unspecified: Secondary | ICD-10-CM | POA: Insufficient documentation

## 2013-05-16 DIAGNOSIS — R109 Unspecified abdominal pain: Secondary | ICD-10-CM | POA: Insufficient documentation

## 2013-05-16 LAB — URINE MICROSCOPIC-ADD ON

## 2013-05-16 LAB — URINALYSIS, ROUTINE W REFLEX MICROSCOPIC
Bilirubin Urine: NEGATIVE
Protein, ur: 100 mg/dL — AB
Urobilinogen, UA: 1 mg/dL (ref 0.0–1.0)

## 2013-05-16 NOTE — ED Provider Notes (Signed)
CSN: [id number]     Arrival date & time 05/16/13  2254 History    This chart was scribed for a non-physician practitioner, Fayrene Helper, PA-C, working with Jones Skene, MD by Frederik Pear, ED Scribe. This patient was seen in room WTR7/WTR7 and the patient's care was started at 2329.   First MD Initiated Contact with Patient 05/16/13 2329     Chief Complaint  Patient presents with  . Urinary Tract Infection   (Consider location/radiation/quality/duration/timing/severity/associated sxs/prior Treatment) The history is provided by the patient and medical records. No language interpreter was used.    HPI Comments: Nichole Kirk is a 32 y.o. female with a h/o of UTIs who presents to the Emergency Department complaining of polyuria that began 2 days ago with associated moderate, constant abdominal pressure, generalized myalgias, chills, and cramping. She also complains of a subjective fever that began yesterday, but resolved on its own without treatment. She also reports 6-7 episodes of diarrhea that began last night. She denies vaginal discharge, but reports she has been spotting over the last few days. Her LMP was 07/30 and lasted 2 days, which she reports is abnormal since her periods typically last 5 days. She denies pain with sexual intercourse, hematochezia, and emesis. She has treated the symptoms at home with cranberry juice without relief. She reports she and her husband have ben actively trying to get pregnant. 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 She reports the first miscarriage occurred 6 weeks into the pregnancy, and the second occurred when she found out that she was actually pregnant.   Past Medical History  Diagnosis Date  . Abnormal Pap smear   . Ovarian cyst   . Gonorrhea 2007  . Urinary tract infection   . Asthma     albuterol inhaler  . Anxiety    Past Surgical History  Procedure Laterality Date  . Cesarean section    .  Dilation and curettage of uterus    . Mab      x 1 - no surgery required 2009  . Wisdom tooth extraction    . Cesarean section  07/29/2011    Procedure: CESAREAN SECTION;  Surgeon: Almon Hercules;  Location: [facility];  Service: Gynecology;  Laterality: N/A;  Repeat  . Cholecystectomy     Family History  Problem Relation Age of Onset  . Cancer Mother     Breast  . Diabetes Mother   . Cancer Paternal Aunt     breast  . Diabetes Maternal Grandfather   . Cancer Maternal Grandmother     Breast  . Lupus Maternal Grandmother   . Cancer Paternal Aunt     breast  . Cancer Paternal Aunt     ovarian  . Multiple sclerosis Sister    History  Substance Use Topics  . Smoking status: Never Smoker   . Smokeless tobacco: Never Used  . Alcohol Use: 0.0 oz/week    1-3 Cans of beer per week     Comment: socially   OB History   Grav Para Term Preterm Abortions TAB SAB Ect Mult Living   4 2 2  0 2 1 1  0 1 3     Review of Systems  Endocrine: Positive for polyuria.  Genitourinary: Positive for dysuria.  Musculoskeletal: Positive for back pain.  All other systems reviewed and are negative.    Allergies  Aspirin and Mushroom extract complex  Home Medications   Current Outpatient Rx  Name  Route  Sig  Dispense  Refill  . medroxyPROGESTERone (DEPO-SUBQ PROVERA) 104 MG/0.65ML injection   Subcutaneous   Inject 104 mg into the skin every 3 (three) months.         . ondansetron (ZOFRAN) 4 MG tablet   Oral   Take 1 tablet (4 mg total) by mouth every 6 (six) hours.   12 tablet   0    BP 117/59  Pulse 79  Temp(Src) 98.3 F (36.8 C) (Oral)  Resp 20  SpO2 100%  LMP 05/03/2013 Physical Exam  Nursing note and vitals reviewed. Constitutional: She is oriented to person, place, and time. She appears well-developed and well-nourished. No distress.  HENT:  Head: Normocephalic and atraumatic.  Eyes: EOM are normal. Pupils are equal, round, and reactive to light.  Neck: Normal range of  motion. Neck supple. No tracheal deviation present.  Cardiovascular: Normal rate.   Pulmonary/Chest: Effort normal. No respiratory distress.  Abdominal: Soft. She exhibits no distension. There is tenderness.  Diffuse suprapubic abdominal tenderness. No guarding or rebound.  Genitourinary:  Bilateral CVA tenderness.   Musculoskeletal: Normal range of motion. She exhibits no edema.  Neurological: She is alert and oriented to person, place, and time.  Skin: Skin is warm and dry.  Psychiatric: She has a normal mood and affect. Her behavior is normal.    ED Course   Procedures (including critical care time)  DIAGNOSTIC STUDIES: Oxygen Saturation is 100% on room air, normal by my interpretation.    COORDINATION OF CARE:  11:26- Discussed planned course of treatment with the patient, including a pregnancy test, and UA with reflex microscopic, who is agreeable at this time.  Labs Reviewed  URINALYSIS, ROUTINE W REFLEX MICROSCOPIC - Abnormal; Notable for the following:    APPearance CLOUDY (*)    Hgb urine dipstick LARGE (*)    Protein, ur 100 (*)    Leukocytes, UA LARGE (*)    All other components within normal limits  URINE MICROSCOPIC-ADD ON - Abnormal; Notable for the following:    Bacteria, UA MANY (*)    All other components within normal limits  URINE CULTURE  PREGNANCY, URINE   Labs Reviewed  URINALYSIS, ROUTINE W REFLEX MICROSCOPIC   No results found. 1. Pyelonephritis     MDM  BP 117/59  Pulse 79  Temp(Src) 98.3 F (36.8 C) (Oral)  Resp 20  SpO2 100%  LMP 05/03/2013 I personally performed the services described in this documentation, which was scribed in my presence. The recorded information has been reviewed and is accurate.     Fayrene Helper, PA-C 05/17/13 0025

## 2013-05-16 NOTE — ED Notes (Signed)
Pt states that she has urinary frequency, urgency and painful urination; c/o pressure to bladder area; pt reports fever yesterday but denies today; c/o aching and diarrhea

## 2013-05-17 LAB — PREGNANCY, URINE: Preg Test, Ur: NEGATIVE

## 2013-05-17 MED ORDER — CIPROFLOXACIN HCL 500 MG PO TABS
ORAL_TABLET | ORAL | Status: AC
  Filled 2013-05-17: qty 1

## 2013-05-17 MED ORDER — CIPROFLOXACIN HCL 500 MG PO TABS: 500.0000 mg | ORAL_TABLET | Freq: Two times a day (BID) | ORAL | Status: DC

## 2013-05-17 MED ORDER — PHENAZOPYRIDINE HCL 200 MG PO TABS: 200.0000 mg | ORAL_TABLET | Freq: Three times a day (TID) | ORAL | Status: DC

## 2013-05-17 NOTE — ED Provider Notes (Signed)
Medical screening examination/treatment/procedure(s) were performed by non-physician practitioner and as supervising physician I was immediately available for consultation/collaboration.  John-Adam Jenie Parish, M.D.     John-Adam Reza Crymes, MD 05/17/13 0542 

## 2013-05-19 LAB — URINE CULTURE

## 2013-05-20 ENCOUNTER — Telehealth (HOSPITAL_COMMUNITY): Payer: Self-pay | Admitting: Emergency Medicine

## 2013-05-20 NOTE — ED Notes (Signed)
Post ED Visit - Positive Culture Follow-up  Culture report reviewed by antimicrobial stewardship pharmacist: []  Wes Dulaney, Pharm.D., BCPS [x]  Celedonio Miyamoto, Pharm.D., BCPS []  Georgina Pillion, Pharm.D., BCPS []  [place], [address].D., BCPS, AAHIVP []  Estella Husk, Pharm.D., BCPS, AAHIVP  Positive urine culture Treated with Cipro, organism sensitive to the same and no further patient follow-up is required at this time.  Kylie A Holland 05/20/2013, 11:10 AM

## 2013-08-13 ENCOUNTER — Encounter (HOSPITAL_COMMUNITY): Payer: Self-pay | Admitting: Emergency Medicine

## 2013-08-13 ENCOUNTER — Emergency Department (HOSPITAL_COMMUNITY)
Admission: EM | Admit: 2013-08-13 | Discharge: 2013-08-14 | Disposition: A | Discharge status: Home / Self Care | Payer: 59 | Attending: Emergency Medicine | Admitting: Emergency Medicine

## 2013-08-13 DIAGNOSIS — R21 Rash and other nonspecific skin eruption: Secondary | ICD-10-CM | POA: Insufficient documentation

## 2013-08-13 DIAGNOSIS — T7840XA Allergy, unspecified, initial encounter: Secondary | ICD-10-CM

## 2013-08-13 DIAGNOSIS — Z87440 Personal history of urinary (tract) infections: Secondary | ICD-10-CM | POA: Insufficient documentation

## 2013-08-13 DIAGNOSIS — Z8619 Personal history of other infectious and parasitic diseases: Secondary | ICD-10-CM | POA: Insufficient documentation

## 2013-08-13 DIAGNOSIS — Z8742 Personal history of other diseases of the female genital tract: Secondary | ICD-10-CM | POA: Insufficient documentation

## 2013-08-13 DIAGNOSIS — Z8659 Personal history of other mental and behavioral disorders: Secondary | ICD-10-CM | POA: Insufficient documentation

## 2013-08-13 DIAGNOSIS — Z792 Long term (current) use of antibiotics: Secondary | ICD-10-CM | POA: Insufficient documentation

## 2013-08-13 DIAGNOSIS — Z79899 Other long term (current) drug therapy: Secondary | ICD-10-CM | POA: Insufficient documentation

## 2013-08-13 DIAGNOSIS — J45909 Unspecified asthma, uncomplicated: Secondary | ICD-10-CM | POA: Insufficient documentation

## 2013-08-13 MED ORDER — PREDNISONE 20 MG PO TABS
60.0000 mg | ORAL_TABLET | Freq: Once | ORAL | Status: AC
  Administered 2013-08-14: 60 mg via ORAL
  Filled 2013-08-13: qty 3

## 2013-08-13 NOTE — ED Provider Notes (Signed)
CSN: [id number]     Arrival date & time 08/13/13  2230 History   First MD Initiated Contact with Patient 08/13/13 2302      This chart was scribed for non-physician practitioner, Ivonne Andrew, PA-C  working with Sunnie Nielsen, MD by Arlan Organ, ED Scribe. This patient was seen in room WTR4/WLPT4 and the patient's care was started at 11:13 PM.   Chief Complaint  Patient presents with  . Rash    The history is provided by the patient. No language interpreter was used.   HPI Comments: Nichole Kirk is a 32 y.o. female who presents to the Emergency Department complaining of a generalized rash that started 2 days ago.  Pt also reports associated itching. She initially noted bumps on her back which spread to other areas of her body over night.  Patient is unsure what may have caused symptoms but states the night [place] and was leaning against a carpeted wall which she did note to be itchy against her back. She states she took benadryl with no relief. She denies dyspnea or dysphagia. No fever, chills or sweats. No other aggravating or alleviating factors. No other associated symptoms.   Past Medical History  Diagnosis Date  . Abnormal Pap smear   . Ovarian cyst   . Gonorrhea 2007  . Urinary tract infection   . Asthma     albuterol inhaler  . Anxiety    Past Surgical History  Procedure Laterality Date  . Cesarean section    . Dilation and curettage of uterus    . Mab      x 1 - no surgery required 2009  . Wisdom tooth extraction    . Cesarean section  07/29/2011    Procedure: CESAREAN SECTION;  Surgeon: Almon Hercules;  Location: [facility];  Service: Gynecology;  Laterality: N/A;  Repeat  . Cholecystectomy     Family History  Problem Relation Age of Onset  . Cancer Mother     Breast  . Diabetes Mother   . Cancer Paternal Aunt     breast  . Diabetes Maternal Grandfather   . Cancer Maternal Grandmother     Breast  . Lupus Maternal Grandmother   .  Cancer Paternal Aunt     breast  . Cancer Paternal Aunt     ovarian  . Multiple sclerosis Sister    History  Substance Use Topics  . Smoking status: Never Smoker   . Smokeless tobacco: Never Used  . Alcohol Use: 0.0 oz/week    1-3 Cans of beer per week     Comment: socially   OB History   Grav Para Term Preterm Abortions TAB SAB Ect Mult Living   4 2 2  0 2 1 1  0 1 3     Review of Systems  All other systems reviewed and are negative.    Allergies  Aspirin and Mushroom extract complex  Home Medications   Current Outpatient Rx  Name  Route  Sig  Dispense  Refill  . budesonide-formoterol (SYMBICORT) 160-4.5 MCG/ACT inhaler   Inhalation   Inhale 2 puffs into the lungs 2 (two) times daily.         . ciprofloxacin (CIPRO) 500 MG tablet   Oral   Take 1 tablet (500 mg total) by mouth 2 (two) times daily.   14 tablet   0   . HYDROcodone-acetaminophen (NORCO/VICODIN) 5-325 MG per tablet   Oral   Take  1 tablet by mouth every 6 (six) hours as needed for pain.         . phenazopyridine (PYRIDIUM) 200 MG tablet   Oral   Take 1 tablet (200 mg total) by mouth 3 (three) times daily.   6 tablet   0   . Prenatal Vit-Fe Fumarate-FA (PRENATAL MULTIVITAMIN) TABS tablet   Oral   Take 1 tablet by mouth daily at 12 noon.          BP 111/65  Pulse 68  Temp(Src) 98.3 F (36.8 C) (Oral)  Resp 20  SpO2 100%  LMP 07/27/2013  Physical Exam  Nursing note and vitals reviewed. Constitutional: She is oriented to person, place, and time. She appears well-developed and well-nourished.  HENT:  Head: Normocephalic and atraumatic.  Eyes: EOM are normal.  Neck: Normal range of motion.  Cardiovascular: Normal rate.   Pulmonary/Chest: Effort normal.  Musculoskeletal: Normal range of motion.  Neurological: She is alert and oriented to person, place, and time.  Skin: Skin is warm and dry. Rash noted.  Maculopapular rash over bilateral arms, torso, and lower extremities    Psychiatric: She has a normal mood and affect. Her behavior is normal.    ED Course  Procedures   DIAGNOSTIC STUDIES: Oxygen Saturation is 100% on RA, Normal by my interpretation.    COORDINATION OF CARE: 11:13 PM- Will give dose of steroids. Discussed treatment plan with pt at bedside and pt agreed to plan.       MDM   1. Rash   2. Allergic reaction, initial encounter      I personally performed the services described in this documentation, which was scribed in my presence. The recorded information has been reviewed and is accurate.   Angus Seller, PA-C 08/14/13 [phone]

## 2013-08-13 NOTE — ED Notes (Addendum)
Pt c/o generalized rash since Friday. C/o itching only. Denies trouble breathing or swallowing. Took OTC benadryl without relief. Pt has no other person with other exposure

## 2013-08-14 NOTE — ED Provider Notes (Signed)
Medical screening examination/treatment/procedure(s) were performed by non-physician practitioner and as supervising physician I was immediately available for consultation/collaboration.   Sunnie Nielsen, MD 08/14/13 [phone]

## 2013-09-20 ENCOUNTER — Telehealth: Payer: Self-pay

## 2013-09-20 NOTE — Telephone Encounter (Signed)
Called her, she has 3 children, is trying to get pregnant again, she has been using ovulation tests to help. She states all the tests have been positive. Advised her the tests may be bad batch. She already bought new ones. Advised her to take pregnancy test, she may be pregnant. Home pregnancy test have been negative. She wants to know if it is too early for blood tests for pregnancy. Advised not sure, but will find out ( her next period is due on 09/24/13, so she has not missed a cycle yet) please advise.

## 2013-09-20 NOTE — Telephone Encounter (Signed)
Patient would like a nurse to call her with questions about ovulation please call patient at [phone]

## 2013-09-20 NOTE — Telephone Encounter (Signed)
I would wait until she has missed her menses to do a pregnancy test.  I am not an ovulation expert - I might suggest she call her OB/GYN for more specifics.

## 2013-09-21 NOTE — Telephone Encounter (Addendum)
Patient advised. She now states she had GB surgery in Feb. She has noticed diarrhea recently, advised her this may or may not be related, she Advised her push fluids and come in if not better.

## 2013-09-25 ENCOUNTER — Ambulatory Visit (INDEPENDENT_AMBULATORY_CARE_PROVIDER_SITE_OTHER): Payer: 59 | Admitting: Emergency Medicine

## 2013-09-25 VITALS — BP 120/78 | HR 80 | Temp 98.8°F | Resp 16 | Ht 71.5 in | Wt 215.0 lb

## 2013-09-25 DIAGNOSIS — N949 Unspecified condition associated with female genital organs and menstrual cycle: Secondary | ICD-10-CM

## 2013-09-25 DIAGNOSIS — N926 Irregular menstruation, unspecified: Secondary | ICD-10-CM

## 2013-09-25 DIAGNOSIS — N938 Other specified abnormal uterine and vaginal bleeding: Secondary | ICD-10-CM

## 2013-09-25 LAB — POCT URINE PREGNANCY: Preg Test, Ur: NEGATIVE

## 2013-09-25 NOTE — Progress Notes (Signed)
[facility] [address], [place] [address] [phone]  Date:  09/25/2013   Name:  Nichole Kirk   DOB:  Feb 02, 1981   MRN:  [id number]  PCP:  Geraldo Pitter, MD    Chief Complaint: Ovulation problem   History of Present Illness:  Nichole Kirk is a 32 y.o. very pleasant female patient who presents with the following:  Trying to conceive and using test for ovulation adn it has been positive for 14 days.  Now concerned that she needs labs.  She has persistent negative HCG.   Due for menses and No bleeding.  Denies other complaint or health concern today.   Patient Active Problem List   Diagnosis Date Noted  . HYPERLIPIDEMIA 06/17/2010  . ASTHMA 06/17/2010    Past Medical History  Diagnosis Date  . Abnormal Pap smear   . Ovarian cyst   . Gonorrhea 2007  . Urinary tract infection   . Asthma     albuterol inhaler  . Anxiety     Past Surgical History  Procedure Laterality Date  . Cesarean section    . Dilation and curettage of uterus    . Mab      x 1 - no surgery required 2009  . Wisdom tooth extraction    . Cesarean section  07/29/2011    Procedure: CESAREAN SECTION;  Surgeon: Almon Hercules;  Location: [facility];  Service: Gynecology;  Laterality: N/A;  Repeat  . Cholecystectomy      History  Substance Use Topics  . Smoking status: Never Smoker   . Smokeless tobacco: Never Used  . Alcohol Use: 0.0 oz/week    1-3 Cans of beer per week     Comment: socially    Family History  Problem Relation Age of Onset  . Cancer Mother     Breast  . Diabetes Mother   . Cancer Paternal Aunt     breast  . Diabetes Maternal Grandfather   . Cancer Maternal Grandmother     Breast  . Lupus Maternal Grandmother   . Cancer Paternal Aunt     breast  . Cancer Paternal Aunt     ovarian  . Multiple sclerosis Sister     Allergies  Allergen Reactions  . Aspirin Anaphylaxis    Pt says she can take ibuprofen  . Mushroom Extract Complex Swelling  and Rash    Medication list has been reviewed and updated.  Current Outpatient Prescriptions on File Prior to Visit  Medication Sig Dispense Refill  . budesonide-formoterol (SYMBICORT) 160-4.5 MCG/ACT inhaler Inhale 2 puffs into the lungs 2 (two) times daily.       No current facility-administered medications on file prior to visit.    Review of Systems:  As per HPI, otherwise negative.    Physical Examination: Filed Vitals:   09/25/13 1439  BP: 120/78  Pulse: 80  Temp: 98.8 F (37.1 C)  Resp: 16   Filed Vitals:   09/25/13 1439  Height: 5' 11.5" (1.816 m)  Weight: 215 lb (97.523 kg)   Body mass index is 29.57 kg/(m^2). Ideal Body Weight: Weight in (lb) to have BMI = 25: 181.4   GEN: WDWN, NAD, Non-toxic, Alert & Oriented x 3 HEENT: Atraumatic, Normocephalic.  Ears and Nose: No external deformity. EXTR: No clubbing/cyanosis/edema NEURO: Normal gait.  PSYCH: Normally interactive. Conversant. Not depressed or anxious appearing.  Calm demeanor.    Assessment and Plan: Amenorrhea Labs  Signed,  Leotis Shames  Dareen Piano, MD   Results for orders placed in visit on 09/25/13  POCT URINE PREGNANCY      Result Value Range   Preg Test, Ur Negative

## 2013-09-26 ENCOUNTER — Telehealth: Payer: Self-pay | Admitting: *Deleted

## 2013-09-26 LAB — ESTRADIOL: Estradiol: 131.9 pg/mL

## 2013-09-26 LAB — PROGESTERONE: Progesterone: 7 ng/mL

## 2013-09-26 NOTE — Telephone Encounter (Signed)
Can you please review labs pt called.

## 2013-10-24 ENCOUNTER — Encounter (HOSPITAL_COMMUNITY): Payer: Self-pay | Admitting: Emergency Medicine

## 2013-10-24 ENCOUNTER — Emergency Department (HOSPITAL_COMMUNITY)
Admission: EM | Admit: 2013-10-24 | Discharge: 2013-10-25 | Disposition: A | Discharge status: Home / Self Care | Payer: 59 | Attending: Emergency Medicine | Admitting: Emergency Medicine

## 2013-10-24 DIAGNOSIS — Z8742 Personal history of other diseases of the female genital tract: Secondary | ICD-10-CM | POA: Insufficient documentation

## 2013-10-24 DIAGNOSIS — Z8619 Personal history of other infectious and parasitic diseases: Secondary | ICD-10-CM | POA: Insufficient documentation

## 2013-10-24 DIAGNOSIS — Z87440 Personal history of urinary (tract) infections: Secondary | ICD-10-CM | POA: Insufficient documentation

## 2013-10-24 DIAGNOSIS — Z8659 Personal history of other mental and behavioral disorders: Secondary | ICD-10-CM | POA: Insufficient documentation

## 2013-10-24 DIAGNOSIS — B9789 Other viral agents as the cause of diseases classified elsewhere: Secondary | ICD-10-CM | POA: Insufficient documentation

## 2013-10-24 DIAGNOSIS — R509 Fever, unspecified: Secondary | ICD-10-CM | POA: Insufficient documentation

## 2013-10-24 DIAGNOSIS — R109 Unspecified abdominal pain: Secondary | ICD-10-CM | POA: Insufficient documentation

## 2013-10-24 DIAGNOSIS — J45909 Unspecified asthma, uncomplicated: Secondary | ICD-10-CM | POA: Insufficient documentation

## 2013-10-24 DIAGNOSIS — B349 Viral infection, unspecified: Secondary | ICD-10-CM

## 2013-10-24 DIAGNOSIS — Z79899 Other long term (current) drug therapy: Secondary | ICD-10-CM | POA: Insufficient documentation

## 2013-10-24 MED ORDER — ONDANSETRON 4 MG PO TBDP
4.0000 mg | ORAL_TABLET | Freq: Once | ORAL | Status: DC
  Filled 2013-10-24: qty 1

## 2013-10-24 NOTE — ED Notes (Signed)
Patient is alert and oriented x3.  She states that she had a fever yesterday and had taken some ibuprofen  And now the fever has gone.  This morning patient states that she started having nausea and vomiting.  She  Has not been able to keep anything on her stomach.  She adds that she also has been having heartburn.

## 2013-10-24 NOTE — ED Notes (Signed)
Pt ambulatory to exam room with steady gait.  

## 2013-10-24 NOTE — ED Notes (Signed)
Pt states she is not nauseous anymore. States she feels like she has burning to mid sternal area, "like heartburn".

## 2013-10-25 MED ORDER — GI COCKTAIL ~~LOC~~
30.0000 mL | Freq: Once | ORAL | Status: AC
  Administered 2013-10-25: 30 mL via ORAL
  Filled 2013-10-25: qty 30

## 2013-10-25 NOTE — ED Provider Notes (Signed)
CSN: [id number]     Arrival date & time 10/24/13  2048 History   First MD Initiated Contact with Patient 10/24/13 2346     Chief Complaint  Patient presents with  . Nausea  . Emesis   (Consider location/radiation/quality/duration/timing/severity/associated sxs/prior Treatment) HPI Comments: Patient states, yesterday morning, 30 hours ago.  She had a fever.  That resolved by noon, at 8 AM.  She had one episode of nausea and vomiting.  At noon.  She had another episode of nausea and vomiting.  She has not had any further episodes of vomiting for the past 12 hours.  She is in the emergency department tonight, because she has some heartburn, and just wants it checked out.  She does not have any cardiac history.  She is not short of, breath.  She's not having any chest, pain.  She's had no recurrent fevers. She does have a history of, asthma.  She has not reported any coughing, or respiratory issues  Patient is a 33 y.o. female presenting with vomiting. The history is provided by the patient.  Emesis Severity:  Mild Timing:  Intermittent Able to tolerate:  Liquids Progression:  Resolved Relieved by:  Nothing Worsened by:  Nothing tried Ineffective treatments:  None tried Associated symptoms: abdominal pain   Associated symptoms: no chills, no diarrhea and no headaches   Associated symptoms comment:  Indigestion Risk factors: no alcohol use, no sick contacts and no suspect food intake     Past Medical History  Diagnosis Date  . Abnormal Pap smear   . Ovarian cyst   . Gonorrhea 2007  . Urinary tract infection   . Asthma     albuterol inhaler  . Anxiety    Past Surgical History  Procedure Laterality Date  . Cesarean section    . Dilation and curettage of uterus    . Mab      x 1 - no surgery required 2009  . Wisdom tooth extraction    . Cesarean section  07/29/2011    Procedure: CESAREAN SECTION;  Surgeon: Almon HerculesKendra H. Ross;  Location: [facility];  Service: Gynecology;  Laterality: N/A;   Repeat  . Cholecystectomy     Family History  Problem Relation Age of Onset  . Cancer Mother     Breast  . Diabetes Mother   . Cancer Paternal Aunt     breast  . Diabetes Maternal Grandfather   . Cancer Maternal Grandmother     Breast  . Lupus Maternal Grandmother   . Cancer Paternal Aunt     breast  . Cancer Paternal Aunt     ovarian  . Multiple sclerosis Sister    History  Substance Use Topics  . Smoking status: Never Smoker   . Smokeless tobacco: Never Used  . Alcohol Use: 0.0 oz/week    1-3 Cans of beer per week     Comment: socially   OB History   Grav Para Term Preterm Abortions TAB SAB Ect Mult Living   4 2 2  0 2 1 1  0 1 3     Review of Systems  Constitutional: Positive for fever. Negative for chills.  Respiratory: Negative for cough and shortness of breath.   Cardiovascular: Negative for chest pain.  Gastrointestinal: Positive for nausea, vomiting and abdominal pain. Negative for diarrhea.  Genitourinary: Negative for dysuria.  Neurological: Negative for headaches.  All other systems reviewed and are negative.    Allergies  Aspirin and Mushroom extract complex  Home Medications  Current Outpatient Rx  Name  Route  Sig  Dispense  Refill  . budesonide-formoterol (SYMBICORT) 160-4.5 MCG/ACT inhaler   Inhalation   Inhale 2 puffs into the lungs 2 (two) times daily.         Marland Kitchen ibuprofen (ADVIL,MOTRIN) 200 MG tablet   Oral   Take 400 mg by mouth every 6 (six) hours as needed for fever.          BP 106/72  Pulse 83  Temp(Src) 98.7 F (37.1 C) (Oral)  Resp 20  Ht 5\' 7"  (1.702 m)  Wt 213 lb (96.616 kg)  BMI 33.35 kg/m2  SpO2 98%  LMP 09/16/2013 Physical Exam  Nursing note and vitals reviewed. Constitutional: She is oriented to person, place, and time. She appears well-developed and well-nourished.  HENT:  Head: Normocephalic.  Eyes: Pupils are equal, round, and reactive to light.  Neck: Normal range of motion.  Cardiovascular: Normal  rate.   Pulmonary/Chest: Effort normal and breath sounds normal.  Abdominal: Soft. She exhibits no distension.  Musculoskeletal: Normal range of motion.  Neurological: She is alert and oriented to person, place, and time.  Skin: Skin is warm. No rash noted. No pallor.    ED Course  Procedures (including critical care time) Labs Review Labs Reviewed - No data to display Imaging Review No results found.  EKG Interpretation   None       MDM   1. Viral syndrome     Patient is in no distress.  Her vital signs are normal.  She states she needs to get home, and someone is watching her children.  She does states, that she called out sick from work tomorrow, and would like a note She was given a GI cocktail in the emergency department, and discharged   Arman Filter, NP 10/25/13 0012

## 2013-10-25 NOTE — ED Provider Notes (Signed)
Medical screening examination/treatment/procedure(s) were performed by non-physician practitioner and as supervising physician I was immediately available for consultation/collaboration.  EKG Interpretation   None         Lyanne CoKevin M Rihaan Barrack, MD 10/25/13 [phone]

## 2013-11-06 ENCOUNTER — Emergency Department (HOSPITAL_COMMUNITY)
Admission: EM | Admit: 2013-11-06 | Discharge: 2013-11-06 | Disposition: A | Discharge status: Home / Self Care | Payer: 59 | Attending: Emergency Medicine | Admitting: Emergency Medicine

## 2013-11-06 ENCOUNTER — Encounter (HOSPITAL_COMMUNITY): Payer: Self-pay | Admitting: Emergency Medicine

## 2013-11-06 DIAGNOSIS — J111 Influenza due to unidentified influenza virus with other respiratory manifestations: Secondary | ICD-10-CM | POA: Insufficient documentation

## 2013-11-06 DIAGNOSIS — Z8659 Personal history of other mental and behavioral disorders: Secondary | ICD-10-CM | POA: Insufficient documentation

## 2013-11-06 DIAGNOSIS — Z8619 Personal history of other infectious and parasitic diseases: Secondary | ICD-10-CM | POA: Insufficient documentation

## 2013-11-06 DIAGNOSIS — R5381 Other malaise: Secondary | ICD-10-CM | POA: Insufficient documentation

## 2013-11-06 DIAGNOSIS — R5383 Other fatigue: Secondary | ICD-10-CM | POA: Insufficient documentation

## 2013-11-06 DIAGNOSIS — R69 Illness, unspecified: Secondary | ICD-10-CM

## 2013-11-06 DIAGNOSIS — Z8742 Personal history of other diseases of the female genital tract: Secondary | ICD-10-CM | POA: Insufficient documentation

## 2013-11-06 DIAGNOSIS — IMO0002 Reserved for concepts with insufficient information to code with codable children: Secondary | ICD-10-CM | POA: Insufficient documentation

## 2013-11-06 DIAGNOSIS — J45909 Unspecified asthma, uncomplicated: Secondary | ICD-10-CM | POA: Insufficient documentation

## 2013-11-06 DIAGNOSIS — R42 Dizziness and giddiness: Secondary | ICD-10-CM | POA: Insufficient documentation

## 2013-11-06 DIAGNOSIS — Z87440 Personal history of urinary (tract) infections: Secondary | ICD-10-CM | POA: Insufficient documentation

## 2013-11-06 MED ORDER — IBUPROFEN 400 MG PO TABS
800.0000 mg | ORAL_TABLET | Freq: Once | ORAL | Status: AC
  Administered 2013-11-06: 800 mg via ORAL
  Filled 2013-11-06: qty 2

## 2013-11-06 MED ORDER — OSELTAMIVIR PHOSPHATE 75 MG PO CAPS: 75.0000 mg | ORAL_CAPSULE | Freq: Two times a day (BID) | ORAL | Status: DC

## 2013-11-06 NOTE — ED Notes (Signed)
Pt c/o "my body is burning", tearful. Denies cough or sore throat.

## 2013-11-06 NOTE — ED Provider Notes (Signed)
Medical screening examination/treatment/procedure(s) were performed by non-physician practitioner and as supervising physician I was immediately available for consultation/collaboration.  EKG Interpretation   None        Toy BakerAnthony T Roper Tolson, MD 11/06/13 2306

## 2013-11-06 NOTE — ED Provider Notes (Signed)
CSN: [id number]     Arrival date & time 11/06/13  1420 History  This chart was scribed for non-physician practitioner Arthor CaptainAbigail Dino Borntreger, PA-C, working with Toy BakerAnthony T Allen, MD by Nicholos Johnsenise Iheanachor, ED scribe. This patient was seen in room TR08C/TR08C and the patient's care was started at 5:00 PM.    Chief Complaint  Patient presents with  . Influenza   The history is provided by the patient. No language interpreter was used.   HPI Comments: Nichole Kirk is a 33 y.o. female who presents to the Emergency Department complaining of fever, body aches, weakness, lightheadedness, onset this morning. Pt has not taken any medication to relieve sx. Pt has no sick contacts at home. Denies any other cold sx. Denies ear pain, vomiting, loss of appetite, or cough.  Past Medical History  Diagnosis Date  . Abnormal Pap smear   . Ovarian cyst   . Gonorrhea 2007  . Urinary tract infection   . Asthma     albuterol inhaler  . Anxiety    Past Surgical History  Procedure Laterality Date  . Cesarean section    . Dilation and curettage of uterus    . Mab      x 1 - no surgery required 2009  . Wisdom tooth extraction    . Cesarean section  07/29/2011    Procedure: CESAREAN SECTION;  Surgeon: Almon HerculesKendra H. Ross;  Location: [facility];  Service: Gynecology;  Laterality: N/A;  Repeat  . Cholecystectomy     Family History  Problem Relation Age of Onset  . Cancer Mother     Breast  . Diabetes Mother   . Cancer Paternal Aunt     breast  . Diabetes Maternal Grandfather   . Cancer Maternal Grandmother     Breast  . Lupus Maternal Grandmother   . Cancer Paternal Aunt     breast  . Cancer Paternal Aunt     ovarian  . Multiple sclerosis Sister    History  Substance Use Topics  . Smoking status: Never Smoker   . Smokeless tobacco: Never Used  . Alcohol Use: 0.0 oz/week    1-3 Cans of beer per week     Comment: socially   OB History   Grav Para Term Preterm Abortions TAB SAB Ect Mult Living   4 2 2  0 2  1 1  0 1 3     Review of Systems  Constitutional: Positive for fever. Negative for appetite change.  HENT: Negative for ear pain.   Gastrointestinal: Negative for vomiting.  Musculoskeletal: Positive for myalgias.  Neurological: Positive for weakness and light-headedness.    Allergies  Aspirin and Mushroom extract complex  Home Medications   Current Outpatient Rx  Name  Route  Sig  Dispense  Refill  . budesonide-formoterol (SYMBICORT) 160-4.5 MCG/ACT inhaler   Inhalation   Inhale 2 puffs into the lungs 2 (two) times daily.          Triage Vitals: BP 114/82  Pulse 111  Temp(Src) 100 F (37.8 C) (Oral)  Resp 16  SpO2 100%  LMP 09/16/2013 Physical Exam  Nursing note and vitals reviewed. Constitutional: She is oriented to person, place, and time. She appears well-developed and well-nourished. No distress.  HENT:  Head: Normocephalic and atraumatic.  Right Ear: External ear normal.  Left Ear: External ear normal.  Mouth/Throat: Oropharynx is clear and moist.  Eyes: Conjunctivae and EOM are normal. Pupils are equal, round, and reactive to light.  Neck: Neck supple.  No thyromegaly present.  Cardiovascular: Normal rate, regular rhythm and normal heart sounds.  Exam reveals no gallop and no friction rub.   No murmur heard. Pulmonary/Chest: Effort normal and breath sounds normal. No respiratory distress. She has no wheezes. She has no rales.  Abdominal: Soft. There is no tenderness.  Musculoskeletal: Normal range of motion.  Lymphadenopathy:    She has no cervical adenopathy.  Neurological: She is alert and oriented to person, place, and time.  Skin: Skin is warm and dry.  Psychiatric: She has a normal mood and affect. Her behavior is normal.    ED Course  Procedures (including critical care time) DIAGNOSTIC STUDIES: Oxygen Saturation is 100% on room air, normal by my interpretation.    COORDINATION OF CARE: At 5:06 PM: Discussed treatment plan with patient which  includes Tamiflu. Patient agrees.   Labs Review Labs Reviewed - No data to display Imaging Review No results found.  EKG Interpretation   None       MDM  No diagnosis found.  Pt with influenza like illness. Discharged with prescript for Tamiflu. Discussed supportive care at home and return precautions. Pt expresses understanding and agrees with care.  I personally performed the services described in this documentation, which was scribed in my presence. The recorded information has been reviewed and is accurate.       Arthor Captain, PA-C 11/06/13 1747

## 2013-11-06 NOTE — ED Notes (Signed)
Feels like body on fire and  No sneezing or coughing  Fever/ states  This is the 3 rd time  She has had fever chest tightness this am

## 2013-11-06 NOTE — Discharge Instructions (Signed)
Influenza, Adult  Influenza ("the flu") is a viral infection of the respiratory tract. It occurs more often in winter months because people spend more time in close contact with one another. Influenza can make you feel very sick. Influenza easily spreads from person to person (contagious).  CAUSES   Influenza is caused by a virus that infects the respiratory tract. You can catch the virus by breathing in droplets from an infected person's cough or sneeze. You can also catch the virus by touching something that was recently contaminated with the virus and then touching your mouth, nose, or eyes.  SYMPTOMS   Symptoms typically last 4 to 10 days and may include:   Fever.   Chills.   Headache, body aches, and muscle aches.   Sore throat.   Chest discomfort and cough.   Poor appetite.   Weakness or feeling tired.   Dizziness.   Nausea or vomiting.  DIAGNOSIS   Diagnosis of influenza is often made based on your history and a physical exam. A nose or throat swab test can be done to confirm the diagnosis.  RISKS AND COMPLICATIONS  You may be at risk for a more severe case of influenza if you smoke cigarettes, have diabetes, have chronic heart disease (such as heart failure) or lung disease (such as asthma), or if you have a weakened immune system. Elderly people and pregnant women are also at risk for more serious infections. The most common complication of influenza is a lung infection (pneumonia). Sometimes, this complication can require emergency medical care and may be life-threatening.  PREVENTION   An annual influenza vaccination (flu shot) is the best way to avoid getting influenza. An annual flu shot is now routinely recommended for all adults in the [place]  TREATMENT   In mild cases, influenza goes away on its own. Treatment is directed at relieving symptoms. For more severe cases, your caregiver may prescribe antiviral medicines to shorten the sickness. Antibiotic medicines are not effective, because the  infection is caused by a virus, not by bacteria.  HOME CARE INSTRUCTIONS   Only take over-the-counter or prescription medicines for pain, discomfort, or fever as directed by your caregiver.   Use a cool mist humidifier to make breathing easier.   Get plenty of rest until your temperature returns to normal. This usually takes 3 to 4 days.   Drink enough fluids to keep your urine clear or pale yellow.   Cover your mouth and nose when coughing or sneezing, and wash your hands well to avoid spreading the virus.   Stay home from work or school until your fever has been gone for at least 1 full day.  SEEK MEDICAL CARE IF:    You have chest pain or a deep cough that worsens or produces more mucus.   You have nausea, vomiting, or diarrhea.  SEEK IMMEDIATE MEDICAL CARE IF:    You have difficulty breathing, shortness of breath, or your skin or nails turn bluish.   You have severe neck pain or stiffness.   You have a severe headache, facial pain, or earache.   You have a worsening or recurring fever.   You have nausea or vomiting that cannot be controlled.  MAKE SURE YOU:   Understand these instructions.   Will watch your condition.   Will get help right away if you are not doing well or get worse.  Document Released: 09/18/2000 Document Revised: 03/22/2012 Document Reviewed: 12/21/2011  ExitCare Patient Information 2014 [employer].      Oseltamivir capsules  What is this medicine?  OSELTAMIVIR (os el TAM i vir) is an antiviral medicine. It is used to prevent and to treat some kinds of influenza or the flu. It will not work for colds or other viral infections.  This medicine may be used for other purposes; ask your health care provider or pharmacist if you have questions.  COMMON BRAND NAME(S): Tamiflu  What should I tell my health care provider before I take this medicine?  They need to know if you have any of the following conditions:  -heart disease  -immune system problems  -kidney disease  -liver  disease  -lung disease  -an unusual or allergic reaction to oseltamivir, other medicines, foods, dyes, or preservatives  -pregnant or trying to get pregnant  -breast-feeding  How should I use this medicine?  Take this medicine by mouth with a glass of water. Follow the directions on the prescription label. Start this medicine at the first sign of flu symptoms. You can take it with or without food. If it upsets your stomach, take it with food. Take your medicine at regular intervals. Do not take your medicine more often than directed. Take all of your medicine as directed even if you think you are better. Do not skip doses or stop your medicine early.  Talk to your pediatrician regarding the use of this medicine in children. While this drug may be prescribed for children as young as 14 days for selected conditions, precautions do apply.  Overdosage: If you think you have taken too much of this medicine contact a poison control center or emergency room at once.  NOTE: This medicine is only for you. Do not share this medicine with others.  What if I miss a dose?  If you miss a dose, take it as soon as you remember. If it is almost time for your next dose (within 2 hours), take only that dose. Do not take double or extra doses.  What may interact with this medicine?  Interactions are not expected.  This list may not describe all possible interactions. Give your health care provider a list of all the medicines, herbs, non-prescription drugs, or dietary supplements you use. Also tell them if you smoke, drink alcohol, or use illegal drugs. Some items may interact with your medicine.  What should I watch for while using this medicine?  Visit your doctor or health care professional for regular check ups. Tell your doctor if your symptoms do not start to get better or if they get worse.  If you have the flu, you may be at an increased risk of developing seizures, confusion, or abnormal behavior. This occurs early in the  illness, and more frequently in children and teens. These events are not common, but may result in accidental injury to the patient. Families and caregivers of patients should watch for signs of unusual behavior and contact a doctor or health care professional right away if the patient shows signs of unusual behavior.  This medicine is not a substitute for the flu shot. Talk to your doctor each year about an annual flu shot.  What side effects may I notice from receiving this medicine?  Side effects that you should report to your doctor or health care professional as soon as possible:  -allergic reactions like skin rash, itching or hives, swelling of the face, lips, or tongue  -anxiety, confusion, unusual behavior  -breathing problems  -hallucination, loss of contact with reality  -redness, blistering, peeling   or loosening of the skin, including inside the mouth  -seizures  Side effects that usually do not require medical attention (report to your doctor or health care professional if they continue or are bothersome):  -cough  -diarrhea  -dizziness  -headache  -nausea, vomiting  -stomach pain  This list may not describe all possible side effects. Call your doctor for medical advice about side effects. You may report side effects to FDA at 1-800-FDA-1088.  Where should I keep my medicine?  Keep out of the reach of children.  Store at room temperature between 15 and 30 degrees C (59 and 86 degrees F). Throw away any unused medicine after the expiration date.  NOTE: This sheet is a summary. It may not cover all possible information. If you have questions about this medicine, talk to your doctor, pharmacist, or health care provider.   2014, [employer]vier/[employer] Standard. (2011-09-25 19:43:38)

## 2014-02-01 ENCOUNTER — Ambulatory Visit (INDEPENDENT_AMBULATORY_CARE_PROVIDER_SITE_OTHER): Payer: 59 | Admitting: Family Medicine

## 2014-02-01 VITALS — BP 122/74 | HR 87 | Temp 98.3°F | Resp 16 | Ht 71.5 in | Wt 214.2 lb

## 2014-02-01 DIAGNOSIS — N949 Unspecified condition associated with female genital organs and menstrual cycle: Secondary | ICD-10-CM

## 2014-02-01 DIAGNOSIS — N898 Other specified noninflammatory disorders of vagina: Secondary | ICD-10-CM

## 2014-02-01 LAB — POCT URINALYSIS DIPSTICK
Bilirubin, UA: NEGATIVE
Blood, UA: NEGATIVE
Glucose, UA: NEGATIVE
KETONES UA: NEGATIVE
Leukocytes, UA: NEGATIVE
Nitrite, UA: NEGATIVE
PROTEIN UA: NEGATIVE
Urobilinogen, UA: 0.2
pH, UA: 5.5

## 2014-02-01 LAB — POCT WET PREP WITH KOH
KOH Prep POC: NEGATIVE
Trichomonas, UA: NEGATIVE
Yeast Wet Prep HPF POC: NEGATIVE

## 2014-02-01 LAB — POCT UA - MICROSCOPIC ONLY
BACTERIA, U MICROSCOPIC: NEGATIVE
CRYSTALS, UR, HPF, POC: NEGATIVE
Casts, Ur, LPF, POC: NEGATIVE
Yeast, UA: NEGATIVE

## 2014-02-01 NOTE — Progress Notes (Signed)
Discussed with Ms. Nichole PayorGessner, FNP-BC and agree with the above.    Nichole Kirk, MHS, PA-C [facility] [address] [place], [place] [address] [phone] [facility] 02/01/2014 8:21 PM

## 2014-02-01 NOTE — Progress Notes (Signed)
Subjective:    Patient ID: Nichole Kirk, female    DOB: [birth year], 33 y.o.   MRN: [id number]  HPI Patient reports vaginal odor for 3 days. Has gotten stronger over last couple days. Last week had some vaginal discharge.   She had some loose stools last week.  She is currently undergoing fertility treatments with Femara and Ovidrel. She had her Ovidrel injection last week. She called her gyn, Dr. Thana Farrenton who suggested she come to urgent care.  Review of Systems Has had sensation of incomplete urination, no dysuria, no hematuria, no frequency, no fever, no back pain, some abdominal cramping last week, none this week.    Objective:   Physical Exam  Vitals reviewed. Constitutional: She is oriented to person, place, and time. She appears well-developed and well-nourished.  HENT:  Head: Normocephalic and atraumatic.  Eyes: Conjunctivae are normal. Right eye exhibits no discharge. Left eye exhibits no discharge.  Neck: Normal range of motion. Neck supple.  Pulmonary/Chest: Effort normal.  Genitourinary: Uterus normal. Pelvic exam was performed with patient prone. No labial fusion. There is no rash, tenderness, lesion or injury on the right labia. There is no rash, tenderness, lesion or injury on the left labia. Cervix exhibits discharge. Cervix exhibits no motion tenderness and no friability. Right adnexum displays no mass, no tenderness and no fullness. Left adnexum displays no mass, no tenderness and no fullness. Vaginal discharge (moderate amount of thin, white, odorous discharge.) found.  Neurological: She is alert and oriented to person, place, and time.  Skin: Skin is warm and dry.  Psychiatric: She has a normal mood and affect. Her behavior is normal. Judgment and thought content normal.    Results for orders placed in visit on 02/01/14  POCT WET PREP WITH KOH      Result Value Ref Range   Trichomonas, UA Negative     Clue Cells Wet Prep HPF POC 0-2     Epithelial Wet Prep HPF  POC 10-15     Yeast Wet Prep HPF POC neg     Bacteria Wet Prep HPF POC trace     RBC Wet Prep HPF POC 2-5     WBC Wet Prep HPF POC 0-1     KOH Prep POC Negative    POCT UA - MICROSCOPIC ONLY      Result Value Ref Range   WBC, Ur, HPF, POC 0-1     RBC, urine, microscopic 0-2     Bacteria, U Microscopic neg     Mucus, UA trace     Epithelial cells, urine per micros 0-2     Crystals, Ur, HPF, POC neg     Casts, Ur, LPF, POC neg     Yeast, UA neg    POCT URINALYSIS DIPSTICK      Result Value Ref Range   Color, UA yellow     Clarity, UA clear     Glucose, UA neg     Bilirubin, UA neg     Ketones, UA neg     Spec Grav, UA >=1.030     Blood, UA neg     pH, UA 5.5     Protein, UA neg     Urobilinogen, UA 0.2     Nitrite, UA neg     Leukocytes, UA Negative         Assessment & Plan:  1. Vaginal odor - POCT Wet Prep with KOH - POCT UA - Microscopic Only - POCT urinalysis dipstick  -  Labs normal, urine very concentrated. Discussed findings with patient. Encouraged increased hydration and for her to follow up with Dr. Freida Busmanalton.  Emi Belfasteborah B. Jayanna Kroeger, FNP-BC  [facility], [facility]  02/01/2014 8:05 PM

## 2014-06-06 ENCOUNTER — Telehealth: Payer: Self-pay

## 2014-06-06 NOTE — Telephone Encounter (Signed)
Pt would like a refill on symbicort.   Pharmacy:[facility] [address] [facility] [phone]

## 2014-06-07 NOTE — Telephone Encounter (Signed)
Spoke to pt- advised we have not prescribed this medication for her in the past. She states she was planning on coming in to establish care anyhow and would be in today.

## 2014-08-06 DIAGNOSIS — E289 Ovarian dysfunction, unspecified: Secondary | ICD-10-CM | POA: Insufficient documentation

## 2014-08-15 ENCOUNTER — Emergency Department (HOSPITAL_COMMUNITY)
Admission: EM | Admit: 2014-08-15 | Discharge: 2014-08-15 | Disposition: A | Discharge status: Home / Self Care | Payer: 59 | Attending: Emergency Medicine | Admitting: Emergency Medicine

## 2014-08-15 ENCOUNTER — Encounter (HOSPITAL_COMMUNITY): Payer: Self-pay | Admitting: Emergency Medicine

## 2014-08-15 DIAGNOSIS — Z79899 Other long term (current) drug therapy: Secondary | ICD-10-CM | POA: Diagnosis not present

## 2014-08-15 DIAGNOSIS — Z8619 Personal history of other infectious and parasitic diseases: Secondary | ICD-10-CM | POA: Insufficient documentation

## 2014-08-15 DIAGNOSIS — Z87440 Personal history of urinary (tract) infections: Secondary | ICD-10-CM | POA: Diagnosis not present

## 2014-08-15 DIAGNOSIS — Z8742 Personal history of other diseases of the female genital tract: Secondary | ICD-10-CM | POA: Insufficient documentation

## 2014-08-15 DIAGNOSIS — M79605 Pain in left leg: Secondary | ICD-10-CM | POA: Diagnosis not present

## 2014-08-15 DIAGNOSIS — J45909 Unspecified asthma, uncomplicated: Secondary | ICD-10-CM | POA: Diagnosis not present

## 2014-08-15 DIAGNOSIS — Z8659 Personal history of other mental and behavioral disorders: Secondary | ICD-10-CM | POA: Insufficient documentation

## 2014-08-15 LAB — CBG MONITORING, ED: GLUCOSE-CAPILLARY: 93 mg/dL (ref 70–99)

## 2014-08-15 MED ORDER — HYDROCODONE-ACETAMINOPHEN 5-325 MG PO TABS: 1.0000 | ORAL_TABLET | ORAL | Status: DC | PRN

## 2014-08-15 MED ORDER — CYCLOBENZAPRINE HCL 10 MG PO TABS: 10.0000 mg | ORAL_TABLET | Freq: Two times a day (BID) | ORAL | Status: DC | PRN

## 2014-08-15 NOTE — ED Notes (Signed)
Patient c/o left leg pain, described as "numbness, throbbing, shooting to my toes" x2 days, with no previous injury to same. Patient rates pain 10/10. Denies N/V, fever, chills.

## 2014-08-15 NOTE — Discharge Instructions (Signed)
FOLLOW UP WITH YOUR PRIMARY CARE DOCTOR OR WITH [facility] FOR FURTHER OUTPATIENT EVALUATION OF LEFT LEG PAIN.

## 2014-08-15 NOTE — ED Provider Notes (Signed)
CSN: [id number]     Arrival date & time 08/15/14  0037 History   First MD Initiated Contact with Patient 08/15/14 0239     Chief Complaint  Patient presents with  . Leg Pain     (Consider location/radiation/quality/duration/timing/severity/associated sxs/prior Treatment) Patient is a 33 y.o. female presenting with leg pain. The history is provided by the patient. No language interpreter was used.  Leg Pain Location:  Leg Time since incident:  2 days Injury: no   Associated symptoms: no back pain and no fever   Associated symptoms comment:  Left LE pain described as sharp, shooting and intermittent. She has no back pain, numbness or weakness. Pain affects the knee and sends sharp pain intermittently to foot. No swelling or discoloration at any time. She reports she fell tonight when she attempted to stand, causing her to fall.    Past Medical History  Diagnosis Date  . Abnormal Pap smear   . Ovarian cyst   . Gonorrhea 2007  . Urinary tract infection   . Asthma     albuterol inhaler  . Anxiety    Past Surgical History  Procedure Laterality Date  . Cesarean section    . Dilation and curettage of uterus    . Mab      x 1 - no surgery required 2009  . Wisdom tooth extraction    . Cesarean section  07/29/2011    Procedure: CESAREAN SECTION;  Surgeon: Almon HerculesKendra H. Ross;  Location: [facility];  Service: Gynecology;  Laterality: N/A;  Repeat  . Cholecystectomy     Family History  Problem Relation Age of Onset  . Cancer Mother     Breast  . Diabetes Mother   . Cancer Paternal Aunt     breast  . Diabetes Maternal Grandfather   . Cancer Maternal Grandmother     Breast  . Lupus Maternal Grandmother   . Cancer Paternal Aunt     breast  . Cancer Paternal Aunt     ovarian  . Multiple sclerosis Sister    History  Substance Use Topics  . Smoking status: Never Smoker   . Smokeless tobacco: Never Used  . Alcohol Use: 0.0 oz/week    1-3 Cans of beer per week     Comment: socially    OB History    Gravida Para Term Preterm AB TAB SAB Ectopic Multiple Living   4 2 2  0 2 1 1  0 1 3     Review of Systems  Constitutional: Negative for fever and chills.  HENT: Negative.   Respiratory: Negative.   Cardiovascular: Negative.   Gastrointestinal: Negative.   Genitourinary: Negative.   Musculoskeletal: Negative for back pain.       See HPI.  Skin: Negative.   Neurological: Negative.  Negative for weakness and numbness.      Allergies  Aspirin and Mushroom extract complex  Home Medications   Prior to Admission medications   Medication Sig Start Date End Date Taking? Authorizing Provider  budesonide-formoterol (SYMBICORT) 160-4.5 MCG/ACT inhaler Inhale 2 puffs into the lungs 2 (two) times daily.   Yes Historical Provider, MD  Choriogonadotropin Alfa (OVIDREL) 250 MCG/0.5ML injection Inject 250 mcg into the skin.    Historical Provider, MD  letrozole (FEMARA) 2.5 MG tablet Take 2.5 mg by mouth daily.    Historical Provider, MD  medroxyPROGESTERone (DEPO-PROVERA) 150 MG/ML injection Inject 150 mg into the muscle every 3 (three) months. 06/05/14   Historical Provider, MD  oseltamivir (TAMIFLU)  75 MG capsule Take 1 capsule (75 mg total) by mouth every 12 (twelve) hours. 11/06/13   Abigail Harris, PA-C   BP 101/51 mmHg  Pulse 79  Temp(Src) 98.2 F (36.8 C) (Oral)  Resp 15  Ht 5\' 11"  (1.803 m)  Wt 208 lb (94.348 kg)  BMI 29.02 kg/m2  SpO2 99% Physical Exam  Constitutional: She is oriented to person, place, and time. She appears well-developed and well-nourished.  Neck: Normal range of motion.  Pulmonary/Chest: Effort normal.  Musculoskeletal:  Left LE unremarkable in appearance. No swelling, point tenderness, or discoloration. FROM, full strength. Knee is without effusion or tenderness. No midline or para-lumbar tenderness. No sciatic tenderness.   Neurological: She is alert and oriented to person, place, and time.  Ambulatory without ataxia. No foot drop.  Skin:  Skin is warm and dry.    ED Course  Procedures (including critical care time) Labs Review Labs Reviewed  CBG MONITORING, ED    Imaging Review No results found.   EKG Interpretation None      MDM   Final diagnoses:  None    1. Left LE pain  Patient's exam is unremarkable for objective findings. No strength loss, no neurologic deficits. Will manage symptoms with Flexeril, pain medication and refer for further outpatient evaluation.    Arnoldo HookerShari A Nakeem Murnane, PA-C 08/15/14 [id number]  Olivia Mackielga M Otter, MD 08/15/14 [phone]

## 2014-08-15 NOTE — ED Notes (Signed)
Pt c/o L lateral thigh pain/numbness x 2 days, intermittent sharp pain to foot. Pt states leg gave out tonight while standing. Denies injury

## 2014-08-21 ENCOUNTER — Ambulatory Visit (INDEPENDENT_AMBULATORY_CARE_PROVIDER_SITE_OTHER): Payer: 59 | Admitting: Family Medicine

## 2014-08-21 ENCOUNTER — Encounter: Payer: Self-pay | Admitting: Family Medicine

## 2014-08-21 VITALS — BP 100/68 | HR 76 | Temp 98.7°F | Resp 16 | Ht 71.5 in | Wt 211.6 lb

## 2014-08-21 DIAGNOSIS — Z30011 Encounter for initial prescription of contraceptive pills: Secondary | ICD-10-CM

## 2014-08-21 DIAGNOSIS — Z309 Encounter for contraceptive management, unspecified: Secondary | ICD-10-CM

## 2014-08-21 DIAGNOSIS — J4520 Mild intermittent asthma, uncomplicated: Secondary | ICD-10-CM

## 2014-08-21 DIAGNOSIS — M5432 Sciatica, left side: Secondary | ICD-10-CM

## 2014-08-21 MED ORDER — BUDESONIDE-FORMOTEROL FUMARATE 160-4.5 MCG/ACT IN AERO: 2.0000 | INHALATION_SPRAY | Freq: Two times a day (BID) | RESPIRATORY_TRACT | Status: AC

## 2014-08-21 MED ORDER — NORGESTIM-ETH ESTRAD TRIPHASIC 0.18/0.215/0.25 MG-35 MCG PO TABS: 1.0000 | ORAL_TABLET | Freq: Every day | ORAL | Status: DC

## 2014-08-21 MED ORDER — MELOXICAM 15 MG PO TABS: 15.0000 mg | ORAL_TABLET | Freq: Every day | ORAL | Status: DC

## 2014-08-21 NOTE — Patient Instructions (Signed)
Sciatica Sciatica is pain, weakness, numbness, or tingling along the path of the sciatic nerve. The nerve starts in the lower back and runs down the back of each leg. The nerve controls the muscles in the lower leg and in the back of the knee, while also providing sensation to the back of the thigh, lower leg, and the sole of your foot. Sciatica is a symptom of another medical condition. For instance, nerve damage or certain conditions, such as a herniated disk or bone spur on the spine, pinch or put pressure on the sciatic nerve. This causes the pain, weakness, or other sensations normally associated with sciatica. Generally, sciatica only affects one side of the body. CAUSES   Herniated or slipped disc.  Degenerative disk disease.  A pain disorder involving the narrow muscle in the buttocks (piriformis syndrome).  Pelvic injury or fracture.  Pregnancy.  Tumor (rare). SYMPTOMS  Symptoms can vary from mild to very severe. The symptoms usually travel from the low back to the buttocks and down the back of the leg. Symptoms can include:  Mild tingling or dull aches in the lower back, leg, or hip.  Numbness in the back of the calf or sole of the foot.  Burning sensations in the lower back, leg, or hip.  Sharp pains in the lower back, leg, or hip.  Leg weakness.  Severe back pain inhibiting movement. These symptoms may get worse with coughing, sneezing, laughing, or prolonged sitting or standing. Also, being overweight may worsen symptoms. DIAGNOSIS  Your caregiver will perform a physical exam to look for common symptoms of sciatica. He or she may ask you to do certain movements or activities that would trigger sciatic nerve pain. Other tests may be performed to find the cause of the sciatica. These may include:  Blood tests.  X-rays.  Imaging tests, such as an MRI or CT scan. TREATMENT  Treatment is directed at the cause of the sciatic pain. Sometimes, treatment is not necessary  and the pain and discomfort goes away on its own. If treatment is needed, your caregiver may suggest:  Over-the-counter medicines to relieve pain.  Prescription medicines, such as anti-inflammatory medicine, muscle relaxants, or narcotics.  Applying heat or ice to the painful area.  Steroid injections to lessen pain, irritation, and inflammation around the nerve.  Reducing activity during periods of pain.  Exercising and stretching to strengthen your abdomen and improve flexibility of your spine. Your caregiver may suggest losing weight if the extra weight makes the back pain worse.  Physical therapy.  Surgery to eliminate what is pressing or pinching the nerve, such as a bone spur or part of a herniated disk. HOME CARE INSTRUCTIONS   Only take over-the-counter or prescription medicines for pain or discomfort as directed by your caregiver.  Apply ice to the affected area for 20 minutes, 3-4 times a day for the first 48-72 hours. Then try heat in the same way.  Exercise, stretch, or perform your usual activities if these do not aggravate your pain.  Attend physical therapy sessions as directed by your caregiver.  Keep all follow-up appointments as directed by your caregiver.  Do not wear high heels or shoes that do not provide proper support.  Check your mattress to see if it is too soft. A firm mattress may lessen your pain and discomfort. SEEK IMMEDIATE MEDICAL CARE IF:   You lose control of your bowel or bladder (incontinence).  You have increasing weakness in the lower back, pelvis, buttocks,   or legs.  You have redness or swelling of your back.  You have a burning sensation when you urinate.  You have pain that gets worse when you lie down or awakens you at night.  Your pain is worse than you have experienced in the past.  Your pain is lasting longer than 4 weeks.  You are suddenly losing weight without reason. MAKE SURE YOU:  Understand these  instructions.  Will watch your condition.  Will get help right away if you are not doing well or get worse. Document Released: 09/15/2001 Document Revised: 03/22/2012 Document Reviewed: 01/31/2012 Exit[employer] Patient Information 2015 [employer]. This information is not intended to replace advice given to you by your health care provider. Make sure you discuss any questions you have with your health care provider.  

## 2014-08-21 NOTE — Progress Notes (Signed)
Subjective:    Patient ID: Nichole Kirk, female    DOB: April 04, 1981, 33 y.o.   MRN: [id number]  HPI Patient reports feeling numbness and tingling in left leg for several weeks. She will have sharp, shooting pain down the leg along with a sensation of pins and needles. Last week she went to the ER following a fall when she picked up her 32 year old and her leg gave out. It is getting progressively worse. Gets relief with muscle relaxer. Has not taken any NSAIDs. No known injury. Has long history of back pain.   Last depo provera shot September. She is not happy with length of menstrual cycles and weight gain. She is interested in going back on OCPs- has used ortho tri cyclen in the past with good results. Non smoker. No headaches. [facility]. Up to date on PAP.   Needs refill of symbicort. Uses albuterol sparingly. Last used 3 weeks ago. Has noticed decrease need for albuterol since being on symbicort.   She has a sister and an aunt with MS and is concerned that this might be a symptom of that.  Review of Systems No bowel or bladder changes, no dysuria, nocturia x 4- unchanged for a long time.    Objective:   Physical Exam  Constitutional: She is oriented to person, place, and time. She appears well-developed and well-nourished.  Eyes: Conjunctivae and EOM are normal. Pupils are equal, round, and reactive to light.  Neck: Normal range of motion. Neck supple.  Cardiovascular: Normal rate, regular rhythm, normal heart sounds and intact distal pulses.   Pulmonary/Chest: Effort normal and breath sounds normal.  Musculoskeletal: Normal range of motion. She exhibits no edema or tenderness.       Cervical back: Normal.       Thoracic back: Normal.       Lumbar back: Normal.  Neurological: She is alert and oriented to person, place, and time. She has normal strength. Gait normal.  Reflex Scores:      Bicep reflexes are 2+ on the right  side and 2+ on the left side.      Brachioradialis reflexes are 2+ on the right side and 2+ on the left side.      Patellar reflexes are 1+ on the right side and 1+ on the left side. Skin: Skin is warm and dry.  Psychiatric: She has a normal mood and affect. Her behavior is normal. Judgment and thought content normal.  Vitals reviewed. BP 100/68 mmHg  Pulse 76  Temp(Src) 98.7 F (37.1 C) (Oral)  Resp 16  Ht 5' 11.5" (1.816 m)  Wt 211 lb 9.6 oz (95.981 kg)  BMI 29.10 kg/m2  SpO2 100%  LMP 07/10/2014     Assessment & Plan:  1. Sciatica, left - meloxicam (MOBIC) 15 MG tablet; Take 1 tablet (15 mg total) by mouth daily.  Dispense: 30 tablet; Refill: 0 -Will try this for 2 weeks and if no improvement or if worsening, will refer to Neuro due to family history of MS.  2. Visit for oral contraceptive prescription - Norgestimate-Ethinyl Estradiol Triphasic (ORTHO TRI-CYCLEN, 28,) 0.18/0.215/0.25 MG-35 MCG tablet; Take 1 tablet by mouth daily.  Dispense: 1 Package; Refill: 11  3. Asthma, chronic, mild intermittent, uncomplicated - budesonide-formoterol (SYMBICORT) 160-4.5 MCG/ACT inhaler; Inhale 2 puffs into the lungs 2 (two) times daily.  Dispense: 1 Inhaler; Refill: 11 -follow up in 6 months.  Emi Belfasteborah B. Lajuan Kovaleski,  FNP-BC  [facility], [facility]  08/21/2014 1:26 PM

## 2014-09-03 ENCOUNTER — Ambulatory Visit (INDEPENDENT_AMBULATORY_CARE_PROVIDER_SITE_OTHER): Payer: 59

## 2014-09-03 ENCOUNTER — Ambulatory Visit (INDEPENDENT_AMBULATORY_CARE_PROVIDER_SITE_OTHER): Payer: 59 | Admitting: Family Medicine

## 2014-09-03 VITALS — BP 114/80 | HR 84 | Temp 98.7°F | Resp 16 | Ht 70.75 in | Wt 213.6 lb

## 2014-09-03 DIAGNOSIS — B349 Viral infection, unspecified: Secondary | ICD-10-CM

## 2014-09-03 DIAGNOSIS — L02213 Cutaneous abscess of chest wall: Secondary | ICD-10-CM

## 2014-09-03 DIAGNOSIS — R059 Cough, unspecified: Secondary | ICD-10-CM

## 2014-09-03 DIAGNOSIS — R05 Cough: Secondary | ICD-10-CM

## 2014-09-03 DIAGNOSIS — R6889 Other general symptoms and signs: Secondary | ICD-10-CM

## 2014-09-03 LAB — POCT INFLUENZA A/B
INFLUENZA B, POC: NEGATIVE
Influenza A, POC: NEGATIVE

## 2014-09-03 MED ORDER — SULFAMETHOXAZOLE-TRIMETHOPRIM 800-160 MG PO TABS: 1.0000 | ORAL_TABLET | Freq: Two times a day (BID) | ORAL | Status: DC

## 2014-09-03 MED ORDER — HYDROCODONE-HOMATROPINE 5-1.5 MG/5ML PO SYRP: 5.0000 mL | ORAL_SOLUTION | ORAL | Status: DC | PRN

## 2014-09-03 MED ORDER — BENZONATATE 100 MG PO CAPS: 100.0000 mg | ORAL_CAPSULE | Freq: Three times a day (TID) | ORAL | Status: DC | PRN

## 2014-09-03 NOTE — Progress Notes (Signed)
Procedure: Verbal consent obtained. Patient prepped with alcohol swab. The patient was anesthetized with 7 cc 1% lidocaine with epi.  A 1.5 cm incision was made and purulent material was expressed.  The cystic sac could be visualized and pieces were removed, however, we were unable to invert the sac at this time. The wound was packed and a clean dressing was placed. Patient started on Septra by Dr. Alwyn RenHopper. Patient to f/u in 48 hours.    Deliah BostonMichael Clark, MS, PA-C   9:05 PM, 09/03/2014

## 2014-09-03 NOTE — Progress Notes (Signed)
Subjective: Patient is here for 2 things. She has a place on her left lateral chest wall that has been hurting for several days. She thinks she banged gotten bitten there. There is a knot in the skin.  She got sick this morning. She awakened with fever and chills and sore throat. She's been having head congestion and sneezing and coughing and thinks she might have the flu. Flu shot she thinks she had a flu shot.  Objective: Pleasant lady but obviously ill. TMs normal. Nose congested. Throat without erythema. Neck supple without significant nodes. Chest has soft wheezing bilaterally. More pronounced on forced expiration. Respirations cause or cough.  On the left flank at about the mid axillary line there is a 2.5 cm nodular area very tender with a small central blackhead to it.  Assessment: Viral syndrome rule out influenza Infected sebaceous cyst  Plan: I&D Flu swab   [facility] reading (PRIMARY) by  Dr. Alwyn RenHopper Normal chest  Flu swab negative  This is a viral syndrome. Will treat it symptomatically and place her on antibiotics for the abscess..Marland Kitchen

## 2014-09-03 NOTE — Patient Instructions (Addendum)
Take the sulfamethoxazole one pill twice daily for infection.    Take the cough syrup when needed for coughing. It is sedating, so primarily is valuable at nighttime or when at home. You will need to hand carry this prescription to the pharmacy.  Use the cough pills in the daytime or when workingy 4-6 hours  You can take an antihistamine decongestant such as Claritin-D or Allegra-D or Zyrtec-D if needed for the congestion  Drink plenty of fluids and get enough rest  which are less sedating  Use the inhaler for wheezing 2 inhalations ever Plan to stay off work through tomorrow. If you're still ill Wednesday continue to stay off a day or 2 more if necessary.  Return if worse

## 2014-09-05 ENCOUNTER — Ambulatory Visit (INDEPENDENT_AMBULATORY_CARE_PROVIDER_SITE_OTHER): Payer: 59 | Admitting: Physician Assistant

## 2014-09-05 VITALS — BP 98/66 | HR 83 | Temp 98.7°F | Resp 18 | Ht 71.0 in | Wt 212.4 lb

## 2014-09-05 DIAGNOSIS — R0981 Nasal congestion: Secondary | ICD-10-CM

## 2014-09-05 DIAGNOSIS — L02213 Cutaneous abscess of chest wall: Secondary | ICD-10-CM

## 2014-09-05 DIAGNOSIS — R05 Cough: Secondary | ICD-10-CM

## 2014-09-05 DIAGNOSIS — R059 Cough, unspecified: Secondary | ICD-10-CM

## 2014-09-05 MED ORDER — BENZONATATE 100 MG PO CAPS: 100.0000 mg | ORAL_CAPSULE | Freq: Three times a day (TID) | ORAL | Status: AC | PRN

## 2014-09-05 MED ORDER — DOXYCYCLINE HYCLATE 100 MG PO CAPS: 100.0000 mg | ORAL_CAPSULE | Freq: Two times a day (BID) | ORAL | Status: AC

## 2014-09-05 MED ORDER — GUAIFENESIN ER 1200 MG PO TB12: 1.0000 | ORAL_TABLET | Freq: Two times a day (BID) | ORAL | Status: AC | PRN

## 2014-09-05 NOTE — Progress Notes (Signed)
Subjective:    Patient ID: Nichole Kirk, female    DOB: 07/07/1981, 33 y.o.   MRN: [id number]  HPI  33 year old female with PMH of asthma and anxiety is here today for 48 hr follow up of a sebaceous cyst that was drained and packed.  She states that it does hurt more than it did before the procedure (it was more of an aggravating finding initially).  She is unable to sleep on her left side.  She denies increased swelling, erythema, or streaking.  She is not currently taking the antibiotic because she states that, according to the pharmacy, the antibiotic was never sent.   She states that she has had fever for the last 2 days, but was also present before, with the cold virus.  She continues to complain of her cold symptoms.  She is coughing with filmy white sputum.  She blew her nose this morning, and felt like her right ear popped.  She rubbed her finger and felt a sticky discharge.  She denies ear pain, but the sounds are distorted when laying on her right side.   The fever was 2 days ago of about 102.  Then yesterday it was around 101.8.  She used the hycodan and an ibuprofen for relief.  Her fever broke this morning around 10am and was around 99.  She denies sob, dyspnea, dizziness, nausea, or vomiting.     Past Medical History  Diagnosis Date  . Abnormal Pap smear   . Ovarian cyst   . Gonorrhea 2007  . Urinary tract infection   . Asthma     albuterol inhaler  . Anxiety    Family History  Problem Relation Age of Onset  . Cancer Mother     Breast  . Diabetes Mother   . Cancer Paternal Aunt     breast  . Diabetes Maternal Grandfather   . Cancer Maternal Grandmother     Breast  . Lupus Maternal Grandmother   . Cancer Paternal Aunt     breast  . Cancer Paternal Aunt     ovarian  . Multiple sclerosis Sister    History   Social History  . Marital Status: Married    Spouse Name: N/A    Number of Children: N/A  . Years of Education: N/A   Social History Main Topics  .  Smoking status: Never Smoker   . Smokeless tobacco: Never Used  . Alcohol Use: 0.0 oz/week    1-3 Cans of beer per week     Comment: socially  . Drug Use: No  . Sexual Activity: Yes    Birth Control/ Protection: None, Injection     Comment: pregnant   Other Topics Concern  . None   Social History Narrative  .  Review of Systems ROS otherwise unremarkable unless listed above.      Objective:   Physical Exam  Constitutional: She appears well-developed and well-nourished. No distress.  HENT:  Head: Normocephalic and atraumatic.  Right Ear: No tenderness. Tympanic membrane is erythematous and retracted. No middle ear effusion.  Left Ear: No tenderness. Tympanic membrane is retracted. Tympanic membrane is not injected and not erythematous.  No middle ear effusion.  Nose: Rhinorrhea present. No mucosal edema. Right sinus exhibits no maxillary sinus tenderness and no frontal sinus tenderness. Left sinus exhibits no maxillary sinus tenderness and no frontal sinus tenderness.  Mouth/Throat: No uvula swelling. No oropharyngeal exudate, posterior oropharyngeal edema or posterior oropharyngeal erythema.  Eyes:  Pupils are equal, round, and reactive to light.  Cardiovascular: Normal rate and regular rhythm.  Exam reveals no distant heart sounds and no friction rub.   No murmur heard. Pulmonary/Chest: No accessory muscle usage. No apnea. No respiratory distress. She has no decreased breath sounds. She has no wheezes. She has no rhonchi.  Skin:  Packing removed at left lateral trunk.  Mild erythema and tenderness around incision site.  Incision is beefy red without exudate or sebaceous material.    Psychiatric: She has a normal mood and affect. Her speech is normal and behavior is normal. Thought content normal.     BP 98/66 mmHg  Pulse 83  Temp(Src) 98.7 F (37.1 C) (Oral)  Resp 18  Ht 5\' 11"  (1.803 m)  Wt 212 lb 6.4 oz (96.344 kg)  BMI 29.64 kg/m2  SpO2 98%  LMP 07/10/2014        Assessment & Plan:  33 year old female with PMH of anxiety and asthma is here for wound follow up.  Changing her antibiotic to doxycycline to hopefully cover for possible URI infection of bacterial etiology.  She will return to clinic in 2 days for wound recheck.    Cutaneous abscess of chest wall  doxycycline (VIBRAMYCIN) 100 MG capsule BID for 10 days.    Coughing benzonatate (TESSALON) 100 MG capsule  Nasal congestion  Guaifenesin (MUCINEX MAXIMUM STRENGTH) 1200 MG TB12    Trena PlattStephanie English, PA-C [facility] 12/2/20154:05 PM

## 2014-09-06 ENCOUNTER — Telehealth: Payer: Self-pay

## 2014-09-06 NOTE — Telephone Encounter (Signed)
Mom states daughter has trouble taking the "white" amoxicillan  And would like to switch to the pink  [facility] [address]  [phone]

## 2014-09-07 ENCOUNTER — Ambulatory Visit (INDEPENDENT_AMBULATORY_CARE_PROVIDER_SITE_OTHER): Payer: 59 | Admitting: Physician Assistant

## 2014-09-07 VITALS — BP 102/64 | HR 89 | Temp 98.3°F | Resp 18 | Ht 71.5 in | Wt 212.0 lb

## 2014-09-07 DIAGNOSIS — L0291 Cutaneous abscess, unspecified: Secondary | ICD-10-CM

## 2014-09-08 NOTE — Progress Notes (Signed)
The patient was discussed with me and I agree with the diagnosis and treatment plan.  

## 2014-09-09 ENCOUNTER — Ambulatory Visit (INDEPENDENT_AMBULATORY_CARE_PROVIDER_SITE_OTHER): Payer: 59 | Admitting: Physician Assistant

## 2014-09-09 VITALS — BP 102/62 | HR 83 | Temp 98.4°F | Resp 18 | Ht 71.5 in | Wt 212.0 lb

## 2014-09-09 DIAGNOSIS — L0291 Cutaneous abscess, unspecified: Secondary | ICD-10-CM

## 2014-09-09 NOTE — Progress Notes (Signed)
HPI  Patient presents for wound check following I & D 2 days ago. Denies fever, chills, sweating, and is not painful if not touched. No new erythema or warmth. Is taking antibiotic and is well tolerated. Dressing falling off, but packing in place. Did not change dressing and showered with it on.  Review of Systems  Noted above.    Objective:   Physical Exam  Constitutional: She appears well-developed and well-nourished. No distress.  Blood pressure 102/64, pulse 89, temperature 98.3 F (36.8 C), resp. rate 18, height 5' 11.5" (1.816 m), weight 212 lb (96.163 kg), last menstrual period 07/10/2014, SpO2 99 %. Skin: Skin is warm and dry. No rash noted. She is not diaphoretic. No erythema.  Tissue pink and bleeds easily. Minimal necrotic tissue. 1 1/2 cm depth present.  Procedure  Consent obtained. Irrigated with 5cc 1% lido. Wound explored. Necrotic tissue removed. 1/4 packing placed. Clean dressing placed.    Assessment & Plan:   1. Abscess Wound healing well. RTC 12/6 or 12/7 for wound recheck.   Janan Ridgeishira Verline Kong PA-C  [facility] 09/09/2014 9:29 AM

## 2014-09-09 NOTE — Progress Notes (Signed)
   Subjective:    Patient ID: Jimmey RalphKirsten D Kantner, female    DOB: 1980/12/14, 33 y.o.   MRN: [id number]  HPI Patient presents for wound check follow after I & D 6 days ago. Denies pain, fever, or erythema. Taking antibiotic and it is well tolerated.    Review of Systems  Constitutional: Negative for fever and chills.  Skin: Positive for wound.       Dressing intact.       Objective:   Physical Exam  Constitutional: She is oriented to person, place, and time. She appears well-developed and well-nourished. No distress.  Blood pressure 102/62, pulse 83, temperature 98.4 F (36.9 C), temperature source Oral, resp. rate 18, height 5' 11.5" (1.816 m), weight 212 lb (96.163 kg), last menstrual period 07/10/2014, SpO2 100 %.   HENT:  Head: Normocephalic and atraumatic.  Right Ear: External ear normal.  Left Ear: External ear normal.  Neurological: She is alert and oriented to person, place, and time.  Skin: Skin is warm and dry. No rash noted. She is not diaphoretic. No erythema. No pallor.  Intact dressing removed. Wound border without necrotic tissue. Tissue pink. Depth still larger than surface wound.    Procedure Consent obtained. Irrigation with 5cc 1% lido. Wound explored. No purulence present. 1/2 plain packing placed. Clean dressing placed. Procedure well tolerated.     Assessment & Plan:  1. Abscess Wound healing well. Packed. RTC for wound recheck 09/12/14.   Janan Ridgeishira Devaeh Amadi PA-C  [facility] 09/09/2014 1:51 PM

## 2014-09-09 NOTE — Progress Notes (Signed)
The patient was discussed with me and I agree with the diagnosis and treatment plan.  

## 2014-09-10 NOTE — Progress Notes (Signed)
I have discussed this case with Ms. English, PA-C and agree.  

## 2014-09-10 NOTE — Telephone Encounter (Signed)
Pt is not taking amox abx- this was charted in error.

## 2014-09-12 ENCOUNTER — Ambulatory Visit: Payer: 59 | Admitting: Physician Assistant

## 2014-09-12 VITALS — BP 117/73 | HR 78 | Temp 98.4°F | Resp 16 | Ht 71.0 in | Wt 218.0 lb

## 2014-09-12 DIAGNOSIS — L0291 Cutaneous abscess, unspecified: Secondary | ICD-10-CM

## 2014-09-12 NOTE — Progress Notes (Signed)
   Subjective:    Patient ID: Nichole Kirk, female    DOB: [birth year], 33 y.o.   MRN: [id number]  HPI Patient presents for wound recheck status post I & D 9 days ago. Denies fever, chills, and pain at this time. Finishing up antibiotic. Has changed dressing, but left packing in.   Review of Systems  Constitutional: Negative for fever and chills.  Skin: Positive for wound.       Objective:   Physical Exam  Constitutional: She appears well-developed and well-nourished. No distress.  Blood pressure 117/73, pulse 78, temperature 98.4 F (36.9 C), temperature source Oral, resp. rate 16, height 5\' 11"  (1.803 m), weight 218 lb (98.884 kg), last menstrual period 07/10/2014, SpO2 100 %.   HENT:  Head: Normocephalic and atraumatic.  Right Ear: External ear normal.  Left Ear: External ear normal.  Skin: Skin is warm and dry. No rash noted. She is not diaphoretic. No erythema. No pallor.  Tissue healthy and pink. No necrotic tissue or purulence.   Procedure Consent obtained. Dressing and packing removed. Wound irrigated with 5cc 1% lido. Wound explored. Loose packing placed. Clean dressing placed.     Assessment & Plan:  1. Abscess Wound healing well. Loosely packed. The packing will be removed by patient in 2 days if it has not already fallen out. No additional f/u neccessary. Continue to take antibiotic.   Janan Ridgeishira Kemuel Buchmann PA-C  [facility] 09/12/2014 7:25 PM

## 2014-09-14 NOTE — Progress Notes (Signed)
I have discussed this case with Ms. Brewington, PA-C and agree.  

## 2014-11-22 ENCOUNTER — Ambulatory Visit: Payer: Self-pay

## 2014-11-22 ENCOUNTER — Other Ambulatory Visit: Payer: Self-pay | Admitting: Occupational Medicine

## 2014-11-22 DIAGNOSIS — R52 Pain, unspecified: Secondary | ICD-10-CM

## 2015-01-01 ENCOUNTER — Encounter: Payer: Self-pay | Admitting: Family Medicine

## 2015-01-01 ENCOUNTER — Ambulatory Visit (INDEPENDENT_AMBULATORY_CARE_PROVIDER_SITE_OTHER): Payer: 59 | Admitting: Family Medicine

## 2015-01-01 VITALS — BP 111/73 | HR 66 | Temp 98.3°F | Resp 16 | Ht 71.5 in | Wt 215.0 lb

## 2015-01-01 DIAGNOSIS — E669 Obesity, unspecified: Secondary | ICD-10-CM | POA: Diagnosis not present

## 2015-01-01 DIAGNOSIS — M4120 Other idiopathic scoliosis, site unspecified: Secondary | ICD-10-CM

## 2015-01-01 DIAGNOSIS — R635 Abnormal weight gain: Secondary | ICD-10-CM

## 2015-01-01 DIAGNOSIS — S39011A Strain of muscle, fascia and tendon of abdomen, initial encounter: Secondary | ICD-10-CM

## 2015-01-01 DIAGNOSIS — R8290 Unspecified abnormal findings in urine: Secondary | ICD-10-CM | POA: Diagnosis not present

## 2015-01-01 LAB — POCT URINALYSIS DIPSTICK
Glucose, UA: NEGATIVE
LEUKOCYTES UA: NEGATIVE
Nitrite, UA: NEGATIVE
PROTEIN UA: 30
RBC UA: NEGATIVE
SPEC GRAV UA: 1.025
Urobilinogen, UA: 4
pH, UA: 6.5

## 2015-01-01 LAB — TSH: TSH: 2.134 u[IU]/mL (ref 0.350–4.500)

## 2015-01-01 LAB — POCT UA - MICROSCOPIC ONLY
CASTS, UR, LPF, POC: NEGATIVE
CRYSTALS, UR, HPF, POC: NEGATIVE
Mucus, UA: POSITIVE
YEAST UA: NEGATIVE

## 2015-01-01 MED ORDER — MELOXICAM 15 MG PO TABS
15.0000 mg | ORAL_TABLET | Freq: Every day | ORAL | Status: DC
Start: 2015-01-01 — End: 2015-09-10

## 2015-01-01 NOTE — Patient Instructions (Addendum)
Try purpose or Ceravu for cleansing, don't over wash!  Hydrate, hydrate! Take pepcid or zantac twice a day for 10 days to see if it improves your nausea. Take meloxicam once a day for 5-7 days to see if abdominal pain improved.

## 2015-01-01 NOTE — Progress Notes (Signed)
Subjective:    Patient ID: Nichole Kirk, female    DOB: December 16, 1980, 34 y.o.   MRN: [id number]  HPI Patient presents today with several concerns.   In Feb., she had a computer fall on her back at work. She had some xrays taken and was told she has scoliosis. She is not sure what this means and if she should be concerned about it.  She has been having lower abdominal pain around her csection scar. Sometimes the pain is in her upper abdomen. Pain comes and goes. Certain clothes cause her discomfort. Different positions cause discomfort. Pain eventually subsides. She has decreased her abd work due to pain. Pain seemed to start around the time of her 90 day fitness challenge. It is not related to food. It is sometimes worse in the morning when she first gets out of bed. She has had her gallbladder removed.  She stopped her OCPs. 11/15 and her periods have been irregular. Had one 12/15, 2/2, none this month. Lasted 5 days. Pregnancy test negative. She and her husband would like to have more children. She required fertility assistance with previous pregnancy. Meeting with fertility specialist 01/08/15.   For a few days, she has noticed an odor to her urine. She showers 4x day. Tries to avoid too tight clothing. No vaginal discharge, no dysuria, nocturia x 2 typical for her. Uses dial to wash genital area.   She continues to feel very frustrated that she works out with a Systems analyst twice a week and follows a low carb diet with multiple protein shakes daily and she has been unable to loose weight. She stays very busy and stressed and admits to not getting enough sleep at night. She does nap some during the day.   Review of Systems No fever or chills, no back pain, nausea in the morning with abdominal pain, no vomiting    Objective:   Physical Exam  Constitutional: She is oriented to person, place, and time. She appears well-developed and well-nourished.  HENT:  Head: Normocephalic and  atraumatic.  Eyes: Conjunctivae are normal.  Neck: Normal range of motion. Neck supple.  Cardiovascular: Normal rate.   Pulmonary/Chest: Effort normal.  Abdominal: Soft. Bowel sounds are normal. She exhibits no distension and no mass. There is tenderness (mild) in the left upper quadrant and left lower quadrant. There is no rebound, no guarding and no CVA tenderness. No hernia.  Musculoskeletal: Normal range of motion.       Cervical back: Normal.       Thoracic back: Normal.       Lumbar back: Deformity: lordosis noted.  Shoulders and hips symetrical and even; no obvious lateral curvature.   Neurological: She is alert and oriented to person, place, and time.  Skin: Skin is warm and dry.  Psychiatric: She has a normal mood and affect. Her behavior is normal. Judgment and thought content normal.  Vitals reviewed.  BP 111/73 mmHg  Pulse 66  Temp(Src) 98.3 F (36.8 C) (Oral)  Resp 16  Ht 5' 11.5" (1.816 m)  Wt 215 lb (97.523 kg)  BMI 29.57 kg/m2  SpO2 100%  LMP 11/06/2014  Xray 11/22/14- scoliosis and slight increase in kyphosis.  Previous xray reported stable scoliosis.   Results for orders placed or performed in visit on 01/01/15  TSH  Result Value Ref Range   TSH 2.134 0.350 - 4.500 uIU/mL  POCT urinalysis dipstick  Result Value Ref Range   Color, UA orange  Clarity, UA cloudy    Glucose, UA neg    Bilirubin, UA small    Ketones, UA trace    Spec Grav, UA 1.025    Blood, UA neg    pH, UA 6.5    Protein, UA 30    Urobilinogen, UA 4.0    Nitrite, UA neg    Leukocytes, UA Negative   POCT UA - Microscopic Only  Result Value Ref Range   WBC, Ur, HPF, POC 0-1    RBC, urine, microscopic 0-1    Bacteria, U Microscopic 1+    Mucus, UA positive    Epithelial cells, urine per micros 1-3    Crystals, Ur, HPF, POC neg    Casts, Ur, LPF, POC neg    Yeast, UA neg        Assessment & Plan:  1. Bad odor of urine - UA normal and patient without dysuria. Advised less  aggressive bathing and using milder soap, avoid synthetic and tight undergarments - POCT urinalysis dipstick - POCT UA - Microscopic Only  2. Abdominal muscle strain, initial encounter - meloxicam (MOBIC) 15 MG tablet; Take 1 tablet (15 mg total) by mouth daily.  Dispense: 30 tablet; Refill: 0  3. Obesity - TSH - encouraged patient to look at calories, carbs in her protein shakes - stressed importance of adequate sleep and stress management  4. Weight gain - TSH  5. Scoliosis (and kyphoscoliosis), idiopathic -discussed results of xray and physical findings - can follow up with ortho if she develops symptoms   Emi Belfasteborah B. Ailis Rigaud, FNP-BC  [facility], [facility]  01/03/2015 7:15 AM

## 2015-01-18 ENCOUNTER — Encounter (HOSPITAL_COMMUNITY): Payer: Self-pay | Admitting: Emergency Medicine

## 2015-01-18 ENCOUNTER — Emergency Department (HOSPITAL_COMMUNITY)
Admission: EM | Admit: 2015-01-18 | Discharge: 2015-01-19 | Disposition: A | Discharge status: Home / Self Care | Payer: 59 | Attending: Emergency Medicine | Admitting: Emergency Medicine

## 2015-01-18 DIAGNOSIS — J45909 Unspecified asthma, uncomplicated: Secondary | ICD-10-CM | POA: Insufficient documentation

## 2015-01-18 DIAGNOSIS — Z793 Long term (current) use of hormonal contraceptives: Secondary | ICD-10-CM | POA: Diagnosis not present

## 2015-01-18 DIAGNOSIS — Z791 Long term (current) use of non-steroidal anti-inflammatories (NSAID): Secondary | ICD-10-CM | POA: Insufficient documentation

## 2015-01-18 DIAGNOSIS — N760 Acute vaginitis: Secondary | ICD-10-CM | POA: Insufficient documentation

## 2015-01-18 DIAGNOSIS — Z7951 Long term (current) use of inhaled steroids: Secondary | ICD-10-CM | POA: Insufficient documentation

## 2015-01-18 DIAGNOSIS — Z3202 Encounter for pregnancy test, result negative: Secondary | ICD-10-CM | POA: Insufficient documentation

## 2015-01-18 DIAGNOSIS — Z87440 Personal history of urinary (tract) infections: Secondary | ICD-10-CM | POA: Insufficient documentation

## 2015-01-18 DIAGNOSIS — N751 Abscess of Bartholin's gland: Secondary | ICD-10-CM | POA: Diagnosis not present

## 2015-01-18 DIAGNOSIS — Z8659 Personal history of other mental and behavioral disorders: Secondary | ICD-10-CM | POA: Diagnosis not present

## 2015-01-18 DIAGNOSIS — B9689 Other specified bacterial agents as the cause of diseases classified elsewhere: Secondary | ICD-10-CM

## 2015-01-18 DIAGNOSIS — N898 Other specified noninflammatory disorders of vagina: Secondary | ICD-10-CM | POA: Diagnosis present

## 2015-01-18 LAB — URINALYSIS, ROUTINE W REFLEX MICROSCOPIC
BILIRUBIN URINE: NEGATIVE
Glucose, UA: NEGATIVE mg/dL
Hgb urine dipstick: NEGATIVE
Ketones, ur: NEGATIVE mg/dL
NITRITE: NEGATIVE
PROTEIN: NEGATIVE mg/dL
SPECIFIC GRAVITY, URINE: 1.029 (ref 1.005–1.030)
UROBILINOGEN UA: 1 mg/dL (ref 0.0–1.0)
pH: 6.5 (ref 5.0–8.0)

## 2015-01-18 LAB — POC URINE PREG, ED: Preg Test, Ur: NEGATIVE

## 2015-01-18 LAB — URINE MICROSCOPIC-ADD ON

## 2015-01-18 LAB — WET PREP, GENITAL
TRICH WET PREP: NONE SEEN
Yeast Wet Prep HPF POC: NONE SEEN

## 2015-01-18 MED ORDER — LIDOCAINE HCL 2 % IJ SOLN
10.0000 mL | Freq: Once | INTRAMUSCULAR | Status: DC
  Filled 2015-01-18: qty 20

## 2015-01-18 NOTE — ED Notes (Signed)
Awake. Verbally responsive. Resp even and unlabored. No audible adventitious breath sounds noted. ABC's intact. Pt tolerated vaginal exam well via Lorelle [place], [place].

## 2015-01-18 NOTE — ED Provider Notes (Signed)
CSN: [id number]     Arrival date & time 01/18/15  2043 History   First MD Initiated Contact with Patient 01/18/15 2231     Chief Complaint  Patient presents with  . Vaginal abscess   . Vaginal odor      (Consider location/radiation/quality/duration/timing/severity/associated sxs/prior Treatment) The history is provided by the patient. No language interpreter was used.  Nichole Kirk is a 34 y.o black female who presents for vaginal bump, odor, and pain that began yesterday.  She states the bump is tender and burns when urine touches it. She also has yellow vaginal discharge for the past 5 days. She states she is in no pain now. She had no previous treatment prior to coming to the ED. She denies any fever, chills, abdominal pain, nausea, vomiting, dysuria, hematuria, urinary frequency, or vaginal bleeding. She denies multiple sexual partners and states she is married and monogamous.  Past Medical History  Diagnosis Date  . Abnormal Pap smear   . Ovarian cyst   . Gonorrhea 2007  . Urinary tract infection   . Asthma     albuterol inhaler  . Anxiety    Past Surgical History  Procedure Laterality Date  . Cesarean section    . Dilation and curettage of uterus    . Mab      x 1 - no surgery required 2009  . Wisdom tooth extraction    . Cesarean section  07/29/2011    Procedure: CESAREAN SECTION;  Surgeon: Almon Hercules;  Location: [facility];  Service: Gynecology;  Laterality: N/A;  Repeat  . Cholecystectomy     Family History  Problem Relation Age of Onset  . Cancer Mother     Breast  . Diabetes Mother   . Cancer Paternal Aunt     breast  . Diabetes Maternal Grandfather   . Cancer Maternal Grandmother     Breast  . Lupus Maternal Grandmother   . Cancer Paternal Aunt     breast  . Cancer Paternal Aunt     ovarian  . Multiple sclerosis Sister    History  Substance Use Topics  . Smoking status: Never Smoker   . Smokeless tobacco: Never Used  . Alcohol Use: 0.0 oz/week    1-3  Cans of beer per week     Comment: socially   OB History    Gravida Para Term Preterm AB TAB SAB Ectopic Multiple Living   0 0 1 3     Review of Systems  Genitourinary: Negative for frequency, difficulty urinating and vaginal pain.  Skin: Negative for wound.      Allergies  Aspirin and Mushroom extract complex  Home Medications   Prior to Admission medications   Medication Sig Start Date End Date Taking? Authorizing Provider  budesonide-formoterol (SYMBICORT) 160-4.5 MCG/ACT inhaler Inhale 2 puffs into the lungs 2 (two) times daily. 08/21/14  Yes Emi Belfast, FNP  cyclobenzaprine (FLEXERIL) 10 MG tablet Take 1 tablet (10 mg total) by mouth 2 (two) times daily as needed for muscle spasms. Patient not taking: Reported on 01/18/2015 08/15/14   Elpidio Anis, PA-C  meloxicam (MOBIC) 15 MG tablet Take 1 tablet (15 mg total) by mouth daily. Patient not taking: Reported on 01/18/2015 01/01/15   Emi Belfast, FNP  metroNIDAZOLE (FLAGYL) 500 MG tablet Take 1 tablet (500 mg total) by mouth 2 (two) times daily. 01/19/15   Fumiko Cham Patel-Mills, PA-C  Norgestimate-Ethinyl Estradiol Triphasic (ORTHO TRI-CYCLEN, 28,)  0.18/0.215/0.25 MG-35 MCG tablet Take 1 tablet by mouth daily. Patient not taking: Reported on 01/01/2015 08/21/14   Emi Belfasteborah B Gessner, FNP  sulfamethoxazole-trimethoprim (BACTRIM DS,SEPTRA DS) 800-160 MG per tablet Take 1 tablet by mouth 2 (two) times daily. 01/19/15 01/26/15  Taje Tondreau Patel-Mills, PA-C   BP 146/70 mmHg  Pulse 70  Temp(Src) 98.2 F (36.8 C) (Oral)  Resp 18  SpO2 100%  LMP 01/11/2015 Physical Exam  Constitutional: She is oriented to person, place, and time. She appears well-developed and well-nourished.  Cardiovascular: Normal rate, regular rhythm and normal heart sounds.   Pulmonary/Chest: Effort normal and breath sounds normal.  Abdominal: Soft. There is no tenderness.  Genitourinary:  Pelvic exam: Chaperone present. Patient has copious amounts  of yellow malodorous discharge. She also had a small 1cm abscess on the left labia but not large enough to do an incision and drainage. No open sores or tears.  No vaginal bleeding. No CMT.   Neurological: She is alert and oriented to person, place, and time.  Skin: Skin is warm and dry.  Nursing note and vitals reviewed.   ED Course  Procedures (including critical care time) Labs Review Labs Reviewed  WET PREP, GENITAL - Abnormal; Notable for the following:    Clue Cells Wet Prep HPF POC MODERATE (*)    WBC, Wet Prep HPF POC MODERATE (*)    All other components within normal limits  URINALYSIS, ROUTINE W REFLEX MICROSCOPIC - Abnormal; Notable for the following:    APPearance CLOUDY (*)    Leukocytes, UA TRACE (*)    All other components within normal limits  URINE MICROSCOPIC-ADD ON - Abnormal; Notable for the following:    Squamous Epithelial / LPF MANY (*)    Bacteria, UA FEW (*)    All other components within normal limits  POC URINE PREG, ED  GC/CHLAMYDIA PROBE AMP ([place])    Imaging Review No results found.   EKG Interpretation None      MDM   Final diagnoses:  Bacterial vaginosis  Bartholin's gland abscess  Patient presents for vaginal odor and vaginal bump that is tender.  She has copious amounts of malodorous discharge on exam.  She also has a small 1 cm abscess on the left labia but it is too small to drain. She is afebrile and negative for UTI or pregnancy.   I have put the patient on metronidazole for bacterial vaginosis and bactrim for 7 days for the abscess.  I discussed not drinking alcohol while on these medications. She is to follow up with her pcp or I [facility]  I have given her strict return precautions if the abscess enlarges.  She agrees with the plan.       Catha GosselinHanna Patel-Mills, PA-C 01/19/15 1224  Elwin MochaBlair Walden, MD 01/19/15 [phone]

## 2015-01-18 NOTE — ED Notes (Signed)
Pt reports for the past couple of days she has noticed odor in vagina that is getting worse. Pt also reports possible "bump on lip" of vagina that is irritated when she urinates. Pt states she is actively trying to get pregnant but has used [employer] Bow[place] Eve to get rid of odor.

## 2015-01-18 NOTE — ED Notes (Signed)
Awake. Verbally responsive. Resp even and unlabored. No audible adventitious breath sounds noted. ABC's intact. Reported having vaginal with odor with d/c.

## 2015-01-19 MED ORDER — SULFAMETHOXAZOLE-TRIMETHOPRIM 800-160 MG PO TABS: 1.0000 | ORAL_TABLET | Freq: Two times a day (BID) | ORAL | Status: AC

## 2015-01-19 MED ORDER — METRONIDAZOLE 500 MG PO TABS: 500.0000 mg | ORAL_TABLET | Freq: Two times a day (BID) | ORAL | Status: DC

## 2015-01-19 NOTE — Discharge Instructions (Signed)
Bacterial Vaginosis Follow up with either your primary care physician or [facility]. Take antibiotics as prescribed. Avoid drinking when taking metronidazole.  Bacterial vaginosis is a vaginal infection that occurs when the normal balance of bacteria in the vagina is disrupted. It results from an overgrowth of certain bacteria. This is the most common vaginal infection in women of childbearing age. Treatment is important to prevent complications, especially in pregnant women, as it can cause a premature delivery. CAUSES  Bacterial vaginosis is caused by an increase in harmful bacteria that are normally present in smaller amounts in the vagina. Several different kinds of bacteria can cause bacterial vaginosis. However, the reason that the condition develops is not fully understood. RISK FACTORS Certain activities or behaviors can put you at an increased risk of developing bacterial vaginosis, including:  Having a new sex partner or multiple sex partners.  Douching.  Using an intrauterine device (IUD) for contraception. Women do not get bacterial vaginosis from toilet seats, bedding, swimming pools, or contact with objects around them. SIGNS AND SYMPTOMS  Some women with bacterial vaginosis have no signs or symptoms. Common symptoms include:  Grey vaginal discharge.  A fishlike odor with discharge, especially after sexual intercourse.  Itching or burning of the vagina and vulva.  Burning or pain with urination. DIAGNOSIS  Your health care provider will take a medical history and examine the vagina for signs of bacterial vaginosis. A sample of vaginal fluid may be taken. Your health care provider will look at this sample under a microscope to check for bacteria and abnormal cells. A vaginal pH test may also be done.  TREATMENT  Bacterial vaginosis may be treated with antibiotic medicines. These may be given in the form of a pill or a vaginal cream. A second round of antibiotics may be  prescribed if the condition comes back after treatment.  HOME CARE INSTRUCTIONS   Only take over-the-counter or prescription medicines as directed by your health care provider.  If antibiotic medicine was prescribed, take it as directed. Make sure you finish it even if you start to feel better.  Do not have sex until treatment is completed.  Tell all sexual partners that you have a vaginal infection. They should see their health care provider and be treated if they have problems, such as a mild rash or itching.  Practice safe sex by using condoms and only having one sex partner. SEEK MEDICAL CARE IF:   Your symptoms are not improving after 3 days of treatment.  You have increased discharge or pain.  You have a fever. MAKE SURE YOU:   Understand these instructions.  Will watch your condition.  Will get help right away if you are not doing well or get worse. FOR MORE INFORMATION  Centers for Disease Control and Prevention, Division of STD Prevention: SolutionApps.co.zawww.cdc.gov/std American Sexual Health Association (ASHA): www.ashastd.org  Document Released: 09/21/2005 Document Revised: 07/12/2013 Document Reviewed: 05/03/2013 [facility] Patient Information 2015 [place]. This information is not intended to replace advice given to you by your health care provider. Make sure you discuss any questions you have with your health care provider.  Bartholin's Cyst or Abscess Bartholin's glands are small glands located within the folds of skin (labia) along the sides of the lower opening of the vagina (birth canal). A cyst may develop when the duct of the gland becomes blocked. When this happens, fluid that accumulates within the cyst can become infected. This is known as an abscess. The Bartholin gland produces  a mucous fluid to lubricate the outside of the vagina during sexual intercourse. SYMPTOMS   Patients with a small cyst may not have any symptoms.  Mild discomfort to severe pain depending  on the size of the cyst and if it is infected (abscess).  Pain, redness, and swelling around the lower opening of the vagina.  Painful intercourse.  Pressure in the perineal area.  Swelling of the lips of the vagina (labia).  The cyst or abscess can be on one side or both sides of the vagina. DIAGNOSIS   A large swelling is seen in the lower vagina area by your caregiver.  Painful to touch.  Redness and pain, if it is an abscess. TREATMENT   Sometimes the cyst will go away on its own.  Apply warm wet compresses to the area or take hot sitz baths several times a day.  An incision to drain the cyst or abscess with local anesthesia.  Culture the pus, if it is an abscess.  Antibiotic treatment, if it is an abscess.  Cut open the gland and suture the edges to make the opening of the gland bigger (marsupialization).  Remove the whole gland if the cyst or abscess returns. PREVENTION   Practice good hygiene.  Clean the vaginal area with a mild soap and soft cloth when bathing.  Do not rub hard in the vaginal area when bathing.  Protect the crotch area with a padded cushion if you take long bike rides or ride horses.  Be sure you are well lubricated when you have sexual intercourse. HOME CARE INSTRUCTIONS   If your cyst or abscess was opened, a small piece of gauze, or a drain, may have been placed in the wound to allow drainage. Do not remove this gauze or drain unless directed by your caregiver.  Wear feminine pads, not tampons, as needed for any drainage or bleeding.  If antibiotics were prescribed, take them exactly as directed. Finish the entire course.  Only take over-the-counter or prescription medicines for pain, discomfort, or fever as directed by your caregiver. SEEK IMMEDIATE MEDICAL CARE IF:   You have an increase in pain, redness, swelling, or drainage.  You have bleeding from the wound which results in the use of more than the number of pads suggested by  your caregiver in 24 hours.  You have chills.  You have a fever.  You develop any new problems (symptoms) or aggravation of your existing condition. MAKE SURE YOU:   Understand these instructions.  Will watch your condition.  Will get help right away if you are not doing well or get worse. Document Released: 09/21/2005 Document Revised: 12/14/2011 Document Reviewed: 05/09/2008 [facility] Patient Information 2015 [place], [place]. This information is not intended to replace advice given to you by your health care provider. Make sure you discuss any questions you have with your health care provider.

## 2015-01-19 NOTE — ED Notes (Signed)
Awake. Verbally responsive. A/O x4. Resp even and unlabored. No audible adventitious breath sounds noted. ABC's intact.  

## 2015-01-21 ENCOUNTER — Emergency Department (HOSPITAL_COMMUNITY)
Admission: EM | Admit: 2015-01-21 | Discharge: 2015-01-21 | Disposition: A | Discharge status: Home / Self Care | Payer: 59 | Attending: Emergency Medicine | Admitting: Emergency Medicine

## 2015-01-21 ENCOUNTER — Encounter (HOSPITAL_COMMUNITY): Payer: Self-pay | Admitting: Emergency Medicine

## 2015-01-21 DIAGNOSIS — R51 Headache: Secondary | ICD-10-CM | POA: Insufficient documentation

## 2015-01-21 DIAGNOSIS — Z7951 Long term (current) use of inhaled steroids: Secondary | ICD-10-CM | POA: Diagnosis not present

## 2015-01-21 DIAGNOSIS — Z87440 Personal history of urinary (tract) infections: Secondary | ICD-10-CM | POA: Diagnosis not present

## 2015-01-21 DIAGNOSIS — Z8759 Personal history of other complications of pregnancy, childbirth and the puerperium: Secondary | ICD-10-CM | POA: Diagnosis not present

## 2015-01-21 DIAGNOSIS — Z793 Long term (current) use of hormonal contraceptives: Secondary | ICD-10-CM | POA: Insufficient documentation

## 2015-01-21 DIAGNOSIS — R519 Headache, unspecified: Secondary | ICD-10-CM

## 2015-01-21 DIAGNOSIS — H9211 Otorrhea, right ear: Secondary | ICD-10-CM | POA: Diagnosis not present

## 2015-01-21 DIAGNOSIS — Z8619 Personal history of other infectious and parasitic diseases: Secondary | ICD-10-CM | POA: Insufficient documentation

## 2015-01-21 DIAGNOSIS — J45909 Unspecified asthma, uncomplicated: Secondary | ICD-10-CM | POA: Insufficient documentation

## 2015-01-21 DIAGNOSIS — H53149 Visual discomfort, unspecified: Secondary | ICD-10-CM | POA: Diagnosis not present

## 2015-01-21 DIAGNOSIS — R42 Dizziness and giddiness: Secondary | ICD-10-CM | POA: Insufficient documentation

## 2015-01-21 DIAGNOSIS — N8320 Unspecified ovarian cysts: Secondary | ICD-10-CM | POA: Diagnosis not present

## 2015-01-21 LAB — GC/CHLAMYDIA PROBE AMP (~~LOC~~) NOT AT ARMC
Chlamydia: NEGATIVE
Neisseria Gonorrhea: NEGATIVE

## 2015-01-21 MED ORDER — METOCLOPRAMIDE HCL 5 MG/ML IJ SOLN
10.0000 mg | Freq: Once | INTRAMUSCULAR | Status: AC
  Administered 2015-01-21: 10 mg via INTRAVENOUS
  Filled 2015-01-21: qty 2

## 2015-01-21 MED ORDER — KETOROLAC TROMETHAMINE 30 MG/ML IJ SOLN
15.0000 mg | Freq: Once | INTRAMUSCULAR | Status: AC
  Administered 2015-01-21: 15 mg via INTRAVENOUS
  Filled 2015-01-21: qty 1

## 2015-01-21 MED ORDER — SODIUM CHLORIDE 0.9 % IV BOLUS (SEPSIS)
1000.0000 mL | Freq: Once | INTRAVENOUS | Status: AC
  Administered 2015-01-21: 1000 mL via INTRAVENOUS

## 2015-01-21 MED ORDER — DIPHENHYDRAMINE HCL 50 MG/ML IJ SOLN
25.0000 mg | Freq: Once | INTRAMUSCULAR | Status: AC
  Administered 2015-01-21: 25 mg via INTRAVENOUS
  Filled 2015-01-21: qty 1

## 2015-01-21 NOTE — Discharge Instructions (Signed)

## 2015-01-21 NOTE — ED Provider Notes (Signed)
CSN: [id number]     Arrival date & time 01/21/15  1826 History   First MD Initiated Contact with Patient 01/21/15 2022     Chief Complaint  Patient presents with  . Headache  . ear bleeding    Nichole Kirk is a 34 y.o. female with a history of anxiety who presents to the emergency department complaining of a frontal headache that has been ongoing for the past 2 days. She reports her headache is been constant and associated with photophobia. She reports she has intermittent blurry vision when she sits upright. Patient also reports she had some pinkish discharge from her left ear yesterday and some dark brownish reddish discharge from her right ear today. Patient reports she's been able to eat and drink okay and has been able to take antibiotics. Patient has not taken anything for treatment of her headache today. The patient denies trauma or injury to her head. The patient denies fevers, chills, neck stiffness, nausea, vomiting, abdominal pain, numbness, tingling, weakness, changes to her hearing, rashes or sore throat.  (Consider location/radiation/quality/duration/timing/severity/associated sxs/prior Treatment) HPI  Past Medical History  Diagnosis Date  . Abnormal Pap smear   . Ovarian cyst   . Gonorrhea 2007  . Urinary tract infection   . Asthma     albuterol inhaler  . Anxiety    Past Surgical History  Procedure Laterality Date  . Cesarean section    . Dilation and curettage of uterus    . Mab      x 1 - no surgery required 2009  . Wisdom tooth extraction    . Cesarean section  07/29/2011    Procedure: CESAREAN SECTION;  Surgeon: Almon Hercules;  Location: [facility];  Service: Gynecology;  Laterality: N/A;  Repeat  . Cholecystectomy     Family History  Problem Relation Age of Onset  . Cancer Mother     Breast  . Diabetes Mother   . Cancer Paternal Aunt     breast  . Diabetes Maternal Grandfather   . Cancer Maternal Grandmother     Breast  . Lupus Maternal Grandmother    . Cancer Paternal Aunt     breast  . Cancer Paternal Aunt     ovarian  . Multiple sclerosis Sister    History  Substance Use Topics  . Smoking status: Never Smoker   . Smokeless tobacco: Never Used  . Alcohol Use: 0.0 oz/week    1-3 Cans of beer per week     Comment: socially   OB History    Gravida Para Term Preterm AB TAB SAB Ectopic Multiple Living   0 0 1 3     Review of Systems  Constitutional: Negative for fever and chills.  HENT: Positive for ear discharge. Negative for congestion, ear pain, nosebleeds, rhinorrhea, sore throat and trouble swallowing.   Eyes: Positive for photophobia and visual disturbance. Negative for redness.  Respiratory: Negative for cough, shortness of breath and wheezing.   Cardiovascular: Negative for chest pain and palpitations.  Gastrointestinal: Negative for nausea, vomiting, abdominal pain and diarrhea.  Genitourinary: Negative for dysuria and difficulty urinating.  Musculoskeletal: Negative for back pain, neck pain and neck stiffness.  Skin: Negative for rash.  Neurological: Positive for light-headedness and headaches. Negative for syncope, weakness and numbness.      Allergies  Aspirin and Mushroom extract complex  Home Medications   Prior to Admission medications   Medication Sig Start Date End  Date Taking? Authorizing Provider  budesonide-formoterol (SYMBICORT) 160-4.5 MCG/ACT inhaler Inhale 2 puffs into the lungs 2 (two) times daily. 08/21/14  Yes Emi Belfasteborah B Gessner, FNP  cyclobenzaprine (FLEXERIL) 10 MG tablet Take 1 tablet (10 mg total) by mouth 2 (two) times daily as needed for muscle spasms. 08/15/14  Yes Shari Upstill, PA-C  metroNIDAZOLE (FLAGYL) 500 MG tablet Take 1 tablet (500 mg total) by mouth 2 (two) times daily. 01/19/15  Yes Hanna Patel-Mills, PA-C  sulfamethoxazole-trimethoprim (BACTRIM DS,SEPTRA DS) 800-160 MG per tablet Take 1 tablet by mouth 2 (two) times daily. 01/19/15 01/26/15 Yes Hanna Patel-Mills, PA-C   meloxicam (MOBIC) 15 MG tablet Take 1 tablet (15 mg total) by mouth daily. Patient not taking: Reported on 01/18/2015 01/01/15   Emi Belfasteborah B Gessner, FNP  Norgestimate-Ethinyl Estradiol Triphasic (ORTHO TRI-CYCLEN, 28,) 0.18/0.215/0.25 MG-35 MCG tablet Take 1 tablet by mouth daily. Patient not taking: Reported on 01/01/2015 08/21/14   Emi Belfasteborah B Gessner, FNP   BP 100/56 mmHg  Pulse 80  Temp(Src) 98.6 F (37 C) (Oral)  Resp 16  SpO2 98%  LMP 01/11/2015 Physical Exam  Constitutional: She is oriented to person, place, and time. She appears well-developed and well-nourished. No distress.  Nontoxic appearing.  HENT:  Head: Normocephalic and atraumatic.  Right Ear: External ear normal.  Left Ear: External ear normal.  Nose: Nose normal.  Mouth/Throat: Oropharynx is clear and moist. No oropharyngeal exudate.  Bilateral tympanic membranes are pearly-gray without erythema or loss of landmarks. External auditory canals have a small amount of dried cerumen. No ear discharge. No red tinged cerumen. No ear tenderness. No temporal edema or tenderness.  Eyes: Conjunctivae and EOM are normal. Pupils are equal, round, and reactive to light. Right eye exhibits no discharge. Left eye exhibits no discharge.  Neck: Normal range of motion. Neck supple. No JVD present. No tracheal deviation present.  Cardiovascular: Normal rate, regular rhythm, normal heart sounds and intact distal pulses.  Exam reveals no gallop and no friction rub.   No murmur heard. Pulmonary/Chest: Effort normal and breath sounds normal. No respiratory distress. She has no wheezes. She has no rales.  Abdominal: Soft. She exhibits no distension. There is no tenderness.  Musculoskeletal: She exhibits no edema.  Patient is spontaneously moving all extremities in a coordinated fashion exhibiting good strength.  Lymphadenopathy:    She has no cervical adenopathy.  Neurological: She is alert and oriented to person, place, and time. No cranial  nerve deficit. Coordination normal.  Cranial nerves are intact bilaterally. EOMs intact. Sensation is intact in her bilateral upper and lower extremities. Good and equal grip strength bilaterally. No pronator drift.   Skin: Skin is warm and dry. No rash noted. She is not diaphoretic. No erythema. No pallor.  Psychiatric: She has a normal mood and affect. Her behavior is normal.  Nursing note and vitals reviewed.   ED Course  Procedures (including critical care time) Labs Review Labs Reviewed - No data to display  Imaging Review No results found.   EKG Interpretation None      Filed Vitals:   01/21/15 1844 01/21/15 2253  BP: 122/73 100/56  Pulse: 85 80  Temp: 98.6 F (37 C)   TempSrc: Oral   Resp: 20 16  SpO2: 98% 98%     MDM   Meds given in ED:  Medications  sodium chloride 0.9 % bolus 1,000 mL (1,000 mLs Intravenous New Bag/Given 01/21/15 2115)  metoCLOPramide (REGLAN) injection 10 mg (10 mg Intravenous Given 01/21/15 2115)  diphenhydrAMINE (BENADRYL) injection 25 mg (25 mg Intravenous Given 01/21/15 2115)  ketorolac (TORADOL) 30 MG/ML injection 15 mg (15 mg Intravenous Given 01/21/15 2115)    New Prescriptions   No medications on file    Final diagnoses:  Bad headache   Pt HA treated and improved while in ED.  At revaluation she reports her headache has resolved. She reports her vision is normal and no longer intermittently blurry. Presentation is like pts typical HA and non concerning for [facility], ICH, Meningitis, or temporal arteritis. Pt is afebrile with no focal neuro deficits, nuchal rigidity. Pt is to follow up with PCP to discuss prophylactic medication. Pt verbalizes understanding and is agreeable with plan to discharge. I did not see any blood or discharge from her ears. She denies any ear pain or changes to her hearing. Strict return precautions discussed. I advised the patient to follow-up with their primary care provider this week. I advised the patient to  return to the emergency department with new or worsening symptoms or new concerns. The patient verbalized understanding and agreement with plan.   This patient was discussed with Dr. Juleen China who agrees with assessment and plan.       Everlene Farrier, PA-C 01/21/15 2332  Raeford Razor, MD 01/22/15 1357

## 2015-01-21 NOTE — ED Notes (Signed)
MD at bedside. 

## 2015-01-21 NOTE — ED Notes (Signed)
Pt ambulating independently w/ steady gait on d/c in no acute distress, A&Ox4.D/c instructions reviewed w/ pt and family - pt and family deny any further questions or concerns at present.  

## 2015-01-21 NOTE — ED Notes (Signed)
Pt has had headache x 2 days with blurred vision when she looks around.  Pt also states that she has had blood coming out of her ears; yesterday was pinkish-red blood then today her resident at work noticed her ears bleeding.  Pt denies n/v

## 2015-01-21 NOTE — ED Notes (Signed)
Pt reports HA x2 days, admits to associated nausea, dizziness and blurred vision. Denies history of headaches. A&Ox4, in no acute distress.

## 2015-02-17 ENCOUNTER — Emergency Department (HOSPITAL_COMMUNITY)
Admission: EM | Admit: 2015-02-17 | Discharge: 2015-02-17 | Disposition: A | Discharge status: Home / Self Care | Payer: 59 | Attending: Emergency Medicine | Admitting: Emergency Medicine

## 2015-02-17 ENCOUNTER — Encounter (HOSPITAL_COMMUNITY): Payer: Self-pay | Admitting: Emergency Medicine

## 2015-02-17 DIAGNOSIS — Z87440 Personal history of urinary (tract) infections: Secondary | ICD-10-CM | POA: Diagnosis not present

## 2015-02-17 DIAGNOSIS — Z79899 Other long term (current) drug therapy: Secondary | ICD-10-CM | POA: Insufficient documentation

## 2015-02-17 DIAGNOSIS — Z8619 Personal history of other infectious and parasitic diseases: Secondary | ICD-10-CM | POA: Insufficient documentation

## 2015-02-17 DIAGNOSIS — Z791 Long term (current) use of non-steroidal anti-inflammatories (NSAID): Secondary | ICD-10-CM | POA: Insufficient documentation

## 2015-02-17 DIAGNOSIS — Z7951 Long term (current) use of inhaled steroids: Secondary | ICD-10-CM | POA: Insufficient documentation

## 2015-02-17 DIAGNOSIS — Z792 Long term (current) use of antibiotics: Secondary | ICD-10-CM | POA: Diagnosis not present

## 2015-02-17 DIAGNOSIS — Z8742 Personal history of other diseases of the female genital tract: Secondary | ICD-10-CM | POA: Insufficient documentation

## 2015-02-17 DIAGNOSIS — J45909 Unspecified asthma, uncomplicated: Secondary | ICD-10-CM | POA: Insufficient documentation

## 2015-02-17 DIAGNOSIS — R509 Fever, unspecified: Secondary | ICD-10-CM | POA: Diagnosis present

## 2015-02-17 DIAGNOSIS — Z8659 Personal history of other mental and behavioral disorders: Secondary | ICD-10-CM | POA: Insufficient documentation

## 2015-02-17 DIAGNOSIS — B349 Viral infection, unspecified: Secondary | ICD-10-CM | POA: Insufficient documentation

## 2015-02-17 LAB — RAPID STREP SCREEN (MED CTR MEBANE ONLY): Streptococcus, Group A Screen (Direct): NEGATIVE

## 2015-02-17 MED ORDER — CETIRIZINE-PSEUDOEPHEDRINE ER 5-120 MG PO TB12: 1.0000 | ORAL_TABLET | Freq: Two times a day (BID) | ORAL | Status: DC

## 2015-02-17 NOTE — Discharge Instructions (Signed)

## 2015-02-17 NOTE — ED Notes (Signed)
Pt states sx of on and off fever started on wednesday, pt state she had a scratchy throat and congestion as well. Pt states she has been taking Nyquil, theraflu and dayquil. Denise n/v/d.

## 2015-02-17 NOTE — ED Provider Notes (Signed)
CSN: [id number]     Arrival date & time 02/17/15  1135 History   First MD Initiated Contact with Patient 02/17/15 1144     Chief Complaint  Patient presents with  . Fever     (Consider location/radiation/quality/duration/timing/severity/associated sxs/prior Treatment) Patient is a 34 y.o. female presenting with fever. The history is provided by the patient. No language interpreter was used.  Fever Temp source:  Subjective Severity:  Moderate Onset quality:  Sudden Duration:  5 days Timing:  Intermittent Progression:  Unchanged Relieved by:  Acetaminophen Ineffective treatments:  Acetaminophen Associated symptoms: rhinorrhea and sore throat   Associated symptoms: no cough, no ear pain and no rash     Past Medical History  Diagnosis Date  . Abnormal Pap smear   . Ovarian cyst   . Gonorrhea 2007  . Urinary tract infection   . Asthma     albuterol inhaler  . Anxiety    Past Surgical History  Procedure Laterality Date  . Cesarean section    . Dilation and curettage of uterus    . Mab      x 1 - no surgery required 2009  . Wisdom tooth extraction    . Cesarean section  07/29/2011    Procedure: CESAREAN SECTION;  Surgeon: Almon HerculesKendra H. Ross;  Location: [facility];  Service: Gynecology;  Laterality: N/A;  Repeat  . Cholecystectomy     Family History  Problem Relation Age of Onset  . Cancer Mother     Breast  . Diabetes Mother   . Cancer Paternal Aunt     breast  . Diabetes Maternal Grandfather   . Cancer Maternal Grandmother     Breast  . Lupus Maternal Grandmother   . Cancer Paternal Aunt     breast  . Cancer Paternal Aunt     ovarian  . Multiple sclerosis Sister    History  Substance Use Topics  . Smoking status: Never Smoker   . Smokeless tobacco: Never Used  . Alcohol Use: 0.0 oz/week    1-3 Cans of beer per week     Comment: socially   OB History    Gravida Para Term Preterm AB TAB SAB Ectopic Multiple Living   4 2 2  0 2 1 1  0 1 3     Review of Systems   Constitutional: Positive for fever.  HENT: Positive for rhinorrhea and sore throat. Negative for ear pain.   Respiratory: Negative for cough.   Skin: Negative for rash.  All other systems reviewed and are negative.     Allergies  Aspirin and Mushroom extract complex  Home Medications   Prior to Admission medications   Medication Sig Start Date End Date Taking? Authorizing Provider  budesonide-formoterol (SYMBICORT) 160-4.5 MCG/ACT inhaler Inhale 2 puffs into the lungs 2 (two) times daily. 08/21/14   Emi Belfasteborah B Gessner, FNP  cyclobenzaprine (FLEXERIL) 10 MG tablet Take 1 tablet (10 mg total) by mouth 2 (two) times daily as needed for muscle spasms. 08/15/14   Elpidio AnisShari Upstill, PA-C  meloxicam (MOBIC) 15 MG tablet Take 1 tablet (15 mg total) by mouth daily. Patient not taking: Reported on 01/18/2015 01/01/15   Emi Belfasteborah B Gessner, FNP  metroNIDAZOLE (FLAGYL) 500 MG tablet Take 1 tablet (500 mg total) by mouth 2 (two) times daily. 01/19/15   Hanna Patel-Mills, PA-C  Norgestimate-Ethinyl Estradiol Triphasic (ORTHO TRI-CYCLEN, 28,) 0.18/0.215/0.25 MG-35 MCG tablet Take 1 tablet by mouth daily. Patient not taking: Reported on 01/01/2015 08/21/14   Emi Belfasteborah B Gessner,  FNP   BP 105/64 mmHg  Pulse 91  Temp(Src) 98.7 F (37.1 C) (Oral)  Resp 18  SpO2 100%  LMP 02/13/2015 Physical Exam  Constitutional: She appears well-developed and well-nourished.  HENT:  Right Ear: External ear normal.  Left Ear: External ear normal.  Nose: Rhinorrhea present.  Mouth/Throat: Posterior oropharyngeal erythema present.  Eyes: Conjunctivae are normal. Pupils are equal, round, and reactive to light.  Neck: Normal range of motion. Neck supple.  Cardiovascular: Normal rate and regular rhythm.   Pulmonary/Chest: Effort normal and breath sounds normal.  Musculoskeletal: Normal range of motion.  Neurological: She is alert.  Skin: Skin is warm and dry.  Psychiatric: She has a normal mood and affect.  Nursing note  and vitals reviewed.   ED Course  Procedures (including critical care time) Labs Review Labs Reviewed  RAPID STREP SCREEN  CULTURE, GROUP A STREP    Imaging Review No results found.   EKG Interpretation None      MDM   Final diagnoses:  Viral illness    Likely viral in nature. Discussed symptomatic treatment at home.    Teressa LowerVrinda Candence Sease, NP 02/17/15 [id number]  Toy CookeyMegan Docherty, MD 02/17/15 1559

## 2015-02-20 LAB — CULTURE, GROUP A STREP

## 2015-07-19 ENCOUNTER — Ambulatory Visit (INDEPENDENT_AMBULATORY_CARE_PROVIDER_SITE_OTHER): Payer: 59 | Admitting: Physician Assistant

## 2015-07-19 VITALS — BP 110/72 | HR 73 | Temp 98.4°F | Resp 18 | Ht 71.5 in | Wt 224.0 lb

## 2015-07-19 DIAGNOSIS — Z3042 Encounter for surveillance of injectable contraceptive: Secondary | ICD-10-CM

## 2015-07-19 MED ORDER — MEDROXYPROGESTERONE ACETATE 150 MG/ML IM SUSP
150.0000 mg | Freq: Once | INTRAMUSCULAR | Status: AC
  Administered 2015-07-19: 150 mg via INTRAMUSCULAR

## 2015-07-19 NOTE — Progress Notes (Signed)
   Nichole Kirk  MRN: [id number] DOB: 01/23/1981  Subjective:  Pt presents to clinic for her depo-provera.  She is currently bleeding - she started her menses yesterday.  She was doing infertility treatments for a while but she has now decided that she does not want to get pregnant and would like to start back on the depo-provera.  Patient Active Problem List   Diagnosis Date Noted  . 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 HYPERLIPIDEMIA 06/17/2010  . ASTHMA 06/17/2010    Current Outpatient Prescriptions on File Prior to Visit  Medication Sig Dispense Refill  . budesonide-formoterol (SYMBICORT) 160-4.5 MCG/ACT inhaler Inhale 2 puffs into the lungs 2 (two) times daily. 1 Inhaler 11  . cetirizine-pseudoephedrine (ZYRTEC-D) 5-120 MG per tablet Take 1 tablet by mouth 2 (two) times daily. (Patient not taking: Reported on 07/19/2015) 30 tablet 0  . cyclobenzaprine (FLEXERIL) 10 MG tablet Take 1 tablet (10 mg total) by mouth 2 (two) times daily as needed for muscle spasms. (Patient not taking: Reported on 07/19/2015) 20 tablet 0  . meloxicam (MOBIC) 15 MG tablet Take 1 tablet (15 mg total) by mouth daily. (Patient not taking: Reported on 01/18/2015) 30 tablet 0  . metroNIDAZOLE (FLAGYL) 500 MG tablet Take 1 tablet (500 mg total) by mouth 2 (two) times daily. (Patient not taking: Reported on 07/19/2015) 14 tablet 0  . Norgestimate-Ethinyl Estradiol Triphasic (ORTHO TRI-CYCLEN, 28,) 0.18/0.215/0.25 MG-35 MCG tablet Take 1 tablet by mouth daily. (Patient not taking: Reported on 01/01/2015) 1 Package 11   No current facility-administered medications on file prior to visit.    Allergies  Allergen Reactions  . Aspirin Anaphylaxis    Pt says she can take ibuprofen  . Mushroom Extract Complex Swelling and Rash    Review of Systems Objective:  BP 110/72 mmHg  Pulse 73  Temp(Src) 98.4 F (36.9 C) (Oral)  Resp 18  Ht 5' 11.5" (1.816 m)  Wt 224 lb (101.606 kg)  BMI 30.81 kg/m2  SpO2 98%   LMP 07/18/2015  Physical Exam  Assessment and Plan :  Encounter for surveillance of injectable contraceptive - Plan: medroxyPROGESTERone (DEPO-PROVERA) injection 150 mg  RTC in 12-13 weeks for next injection.  Benny LennertSarah Arlisa Leclere PA-C  [facility] 07/19/2015 1:51 PM

## 2015-07-19 NOTE — Patient Instructions (Signed)
Your next Depo-Provera injection will be due 10/04/15-10/18/15.

## 2015-07-27 NOTE — Progress Notes (Signed)
  Medical screening examination/treatment/procedure(s) were performed by non-physician practitioner and as supervising physician I was immediately available for consultation/collaboration.     

## 2015-09-03 ENCOUNTER — Ambulatory Visit: Payer: 59 | Admitting: Family Medicine

## 2015-09-04 ENCOUNTER — Ambulatory Visit (INDEPENDENT_AMBULATORY_CARE_PROVIDER_SITE_OTHER): Payer: 59 | Admitting: Family Medicine

## 2015-09-04 ENCOUNTER — Encounter: Payer: Self-pay | Admitting: Family Medicine

## 2015-09-04 VITALS — BP 103/68 | HR 71 | Temp 97.9°F | Resp 16 | Ht 71.75 in | Wt 221.8 lb

## 2015-09-04 DIAGNOSIS — Z113 Encounter for screening for infections with a predominantly sexual mode of transmission: Secondary | ICD-10-CM | POA: Diagnosis not present

## 2015-09-04 DIAGNOSIS — Z01818 Encounter for other preprocedural examination: Secondary | ICD-10-CM

## 2015-09-04 DIAGNOSIS — Z130 Encounter for screening for diseases of the blood and blood-forming organs and certain disorders involving the immune mechanism: Secondary | ICD-10-CM | POA: Diagnosis not present

## 2015-09-04 DIAGNOSIS — E669 Obesity, unspecified: Secondary | ICD-10-CM | POA: Diagnosis not present

## 2015-09-04 DIAGNOSIS — J4520 Mild intermittent asthma, uncomplicated: Secondary | ICD-10-CM

## 2015-09-04 DIAGNOSIS — R1032 Left lower quadrant pain: Secondary | ICD-10-CM | POA: Diagnosis not present

## 2015-09-04 DIAGNOSIS — E66811 Obesity, class 1: Secondary | ICD-10-CM

## 2015-09-04 DIAGNOSIS — R1031 Right lower quadrant pain: Secondary | ICD-10-CM | POA: Diagnosis not present

## 2015-09-04 LAB — CBC
HEMATOCRIT: 40.6 % (ref 36.0–46.0)
Hemoglobin: 13.7 g/dL (ref 12.0–15.0)
MCH: 29.1 pg (ref 26.0–34.0)
MCHC: 33.7 g/dL (ref 30.0–36.0)
MCV: 86.4 fL (ref 78.0–100.0)
MPV: 10.4 fL (ref 8.6–12.4)
PLATELETS: 234 10*3/uL (ref 150–400)
RBC: 4.7 MIL/uL (ref 3.87–5.11)
RDW: 13.5 % (ref 11.5–15.5)
WBC: 7.5 10*3/uL (ref 4.0–10.5)

## 2015-09-04 NOTE — Progress Notes (Signed)
Subjective:    Patient ID: Nichole Kirk, female    DOB: 1981-02-08, 34 y.o.   MRN: [id number]  HPI This is a pleasant 34 yo female who presents today requesting STD testing. She is separating from her husband and found out he has been unfaithful.   She continues to struggle with her weigh. She has been exercising and watching diet. She has been very careful with her diet and has not lost any weight. She had cool sculptting of her abdomen without good results. She is planning on having lipo and/ or tummy tuck. She is planning on having her surgery 2 -3/17. She has superficial abdominal pain when she stands or lifts her abdomen. She has had this since her C-section 2010/2012. She has tried to wear binders which cause pain.   She needs EKG and clearance for surgery due to asthma history.    Past Medical History  Diagnosis Date  . Abnormal Pap smear   . Ovarian cyst   . Gonorrhea 2007  . Urinary tract infection   . Asthma     albuterol inhaler  . Anxiety    Past Surgical History  Procedure Laterality Date  . Cesarean section    . Dilation and curettage of uterus    . Mab      x 1 - no surgery required 2009  . Wisdom tooth extraction    . Cesarean section  07/29/2011    Procedure: CESAREAN SECTION;  Surgeon: Almon Hercules;  Location: [facility];  Service: Gynecology;  Laterality: N/A;  Repeat  . Cholecystectomy     Family History  Problem Relation Age of Onset  . Cancer Mother     Breast  . Diabetes Mother   . Cancer Paternal Aunt     breast  . Diabetes Maternal Grandfather   . Cancer Maternal Grandmother     Breast  . Lupus Maternal Grandmother   . Cancer Paternal Aunt     breast  . Cancer Paternal Aunt     ovarian  . Multiple sclerosis Sister    Social History  Substance Use Topics  . Smoking status: Never Smoker   . Smokeless tobacco: Never Used  . Alcohol Use: 0.0 oz/week    1-3 Cans of beer per week     Comment: socially    Review of Systems    Constitutional: Positive for unexpected weight change (gain). Negative for fever and fatigue.  Respiratory: Negative for cough and shortness of breath.   Genitourinary: Negative for dysuria, urgency, vaginal discharge, vaginal pain, menstrual problem and pelvic pain.      Objective:   Physical Exam  Constitutional: She is oriented to person, place, and time. She appears well-developed and well-nourished. No distress.  HENT:  Head: Normocephalic and atraumatic.  Eyes: Conjunctivae are normal.  Neck: Normal range of motion. Neck supple.  Cardiovascular: Normal rate, regular rhythm and normal heart sounds.   Pulmonary/Chest: Effort normal and breath sounds normal.  Abdominal: Soft. Bowel sounds are normal. She exhibits no distension and no mass. Tenderness: mildly tender with manipulation of abdominal folds.  There is no rebound and no guarding.  Musculoskeletal: Normal range of motion. She exhibits no edema.  Neurological: She is alert and oriented to person, place, and time.  Skin: Skin is warm and dry. She is not diaphoretic.  Psychiatric: She has a normal mood and affect. Her behavior is normal. Judgment and thought content normal.  Vitals reviewed.  BP 103/68 mmHg  Pulse  71  Temp(Src) 97.9 F (36.6 C) (Oral)  Resp 16  Ht 5' 11.75" (1.822 m)  Wt 221 lb 12.8 oz (100.608 kg)  BMI 30.31 kg/m2  SpO2 98%  LMP 07/17/2015 Wt Readings from Last 3 Encounters:  09/04/15 221 lb 12.8 oz (100.608 kg)  07/19/15 224 lb (101.606 kg)  01/01/15 215 lb (97.523 kg)  EKG- NSR reviewed with Dr. Merla Richesoolittle Office Spirometry Results: Peak Flow: 422 L/MIN FEV1: 3.39 liters FVC: 4.26 liters FEV1/FVC: 79.6 % FVC  % Predicted: 107 liters FEV % Predicted: 102 liters FeF 25-75: 3.49 liters FeF 25-75 % Predicted: 97     Assessment & Plan:  1. Abdominal wall pain in both lower quadrants - In reviewing the chart, this has been an ongoing, intermittent problem for the patient. The pain is very  superficial in nature without any accompanying concerning symptoms.  2. Obesity (BMI 30.0-34.9) - Encouraged continued exercise and diet, making sure to get enough sleep and manage stress.   3. Screening for STD (sexually transmitted disease) - GC/Chlamydia Probe Amp - RPR - HIV antibody  4. Screening for deficiency anemia - CBC  5. Asthma, chronic, mild intermittent, uncomplicated - PFT PULM FXN SPIROMETRY (94010)- normal spirometry - EKG 12-Lead  6. Preop testing - CBC - PFT PULM FXN SPIROMETRY (94010)- normal spirometry - EKG 12-Lead   Olean Reeeborah Shakaya Bhullar, FNP-BC  [facility]  09/04/2015 12:00 PM

## 2015-09-05 LAB — RPR

## 2015-09-05 LAB — GC/CHLAMYDIA PROBE AMP
CT Probe RNA: NEGATIVE
GC Probe RNA: NEGATIVE

## 2015-09-05 LAB — HIV ANTIBODY (ROUTINE TESTING W REFLEX): HIV 1&2 Ab, 4th Generation: NONREACTIVE

## 2015-09-25 ENCOUNTER — Encounter: Payer: Self-pay | Admitting: Family Medicine

## 2015-09-26 ENCOUNTER — Telehealth: Payer: Self-pay | Admitting: Family Medicine

## 2015-09-26 NOTE — Telephone Encounter (Signed)
Patient had CBC and STD testing done. ONLY faxed CBC results.

## 2015-09-26 NOTE — Telephone Encounter (Signed)
Medical records did not send anything for this patient. I don't know anything about this.

## 2015-09-26 NOTE — Telephone Encounter (Signed)
Marisue IvanLiz from [facility] states that she just received some information via fax on this patient (she does not know the name of the individual who faxed this information). She needs this patient's lab results sent to the office as well. Please fax to [phone], attention Marisue IvanLiz. Contact number [phone]. Patient saw Olean Reeeborah Gessner, FNP on 09/04/2015.

## 2015-10-04 ENCOUNTER — Telehealth: Payer: Self-pay

## 2015-10-04 NOTE — Telephone Encounter (Signed)
Marisue IvanLiz at [facility] requesting EKG and Labs for patient Fax number is [phone]

## 2015-10-08 NOTE — Telephone Encounter (Signed)
Nichole Kirk from the [facility] left voicemail at [phone]. No details given. Cb# [phone].

## 2015-10-08 NOTE — Telephone Encounter (Signed)
Labs refaxed thru Epic and EKG faxed manually with confirmation.

## 2015-10-09 ENCOUNTER — Telehealth: Payer: Self-pay

## 2015-10-09 NOTE — Telephone Encounter (Signed)
Called pt, advised pt of Deborah's message.

## 2015-10-09 NOTE — Telephone Encounter (Signed)
2. Obesity (BMI 30.0-34.9) - Encouraged continued exercise and diet, making sure to get enough sleep and manage stress.    Nichole PoundDeborah did you discuss this with pt?

## 2015-10-09 NOTE — Telephone Encounter (Signed)
Leone PayorGessner   Requesting PHENPARMINE  Weight loss script requested   [facility] [address]    [phone]

## 2015-10-09 NOTE — Telephone Encounter (Signed)
Please let patient know that I do not prescribe phentermine.

## 2015-10-09 NOTE — Telephone Encounter (Signed)
Spoke with Nichole Kirk. She states they are looking for a "chem 7" lab (BMP). Patient only had a CBC done. She will call patient and have her come back in for BMP lab.

## 2015-10-10 ENCOUNTER — Telehealth: Payer: Self-pay | Admitting: Family Medicine

## 2015-10-10 DIAGNOSIS — Z01818 Encounter for other preprocedural examination: Secondary | ICD-10-CM

## 2015-10-10 NOTE — Telephone Encounter (Signed)
Nichole Kirk from [facility] called re: pre-op clearance labs and EKG. Patient was seen on 09/04/15 and had everything done except did not have BMP.  [name] patient can come in for lab only visit. I would contact patient and let her. Patient notified and voiced understanding. Future order for BMP placed. Patient stated she would come in tomorrow. Once we get lab results please fax to [facility] Attn: Nichole Kirk. Ph# [phone] Fax# [phone]

## 2016-08-20 ENCOUNTER — Ambulatory Visit (INDEPENDENT_AMBULATORY_CARE_PROVIDER_SITE_OTHER): Payer: Commercial Managed Care - HMO | Admitting: Family Medicine

## 2016-08-20 VITALS — BP 110/74 | HR 87 | Temp 98.1°F | Resp 18 | Ht 71.75 in | Wt 207.0 lb

## 2016-08-20 DIAGNOSIS — Z30011 Encounter for initial prescription of contraceptive pills: Secondary | ICD-10-CM | POA: Diagnosis not present

## 2016-08-20 LAB — POCT URINE PREGNANCY: Preg Test, Ur: NEGATIVE

## 2016-08-20 MED ORDER — NORGESTIM-ETH ESTRAD TRIPHASIC 0.18/0.215/0.25 MG-35 MCG PO TABS: 1.0000 | ORAL_TABLET | Freq: Every day | ORAL | 11 refills | Status: DC

## 2016-08-20 NOTE — Patient Instructions (Addendum)
IF you received an x-ray today, you will receive an invoice from [facility]. Please contact [facility] at [phone] with questions or concerns regarding your invoice.   IF you received labwork today, you will receive an invoice from [facility]. Please contact [facility] at [phone] with questions or concerns regarding your invoice.   Our billing staff will not be able to assist you with questions regarding bills from these companies.  You will be contacted with the lab results as soon as they are available. The fastest way to get your results is to activate your My Chart account. Instructions are located on the last page of this paperwork. If you have not heard from us regarding the results in 2 weeks, please contact this office.     Contraception Choices Birth control (contraception) is the use of any methods or devices to stop pregnancy from happening. Below are some methods to help avoid pregnancy. Hormonal birth control  A small tube put under the skin of the upper arm (implant). The tube can stay in place for 3 years. The implant must be taken out after 3 years.  Shots given every 3 months.  Pills taken every day.  Patches that are changed once a week.  A ring put into the vagina (vaginal ring). The ring is left in place for 3 weeks and removed for 1 week. Then, a new ring is put in the vagina.  Emergency birth control pills taken after unprotected sex (intercourse). Barrier birth control  A thin covering worn on the penis (female condom) during sex.  A soft, loose covering put into the vagina (female condom) before sex.  A rubber bowl that sits over the cervix (diaphragm). The bowl must be made for you. The bowl is put into the vagina before sex. The bowl is left in place for 6 to 8 hours after sex.  A small, soft cup that fits over the cervix (cervical cap). The cup must be made for you. The cup can be left in place for  48 hours after sex.  A sponge that is put into the vagina before sex.  A chemical that kills or stops sperm from getting into the cervix and uterus (spermicide). The chemical may be a cream, jelly, foam, or pill. Intrauterine (IUD) birth control  IUD birth control is a small, T-shaped piece of plastic. The plastic is put inside the uterus. There are 2 types of IUD:  Copper IUD. The IUD is covered in copper wire. The copper makes a fluid that kills sperm. It can stay in place for 10 years.  Hormone IUD. The hormone stops pregnancy from happening. It can stay in place for 5 years. Permanent methods  When the woman has her fallopian tubes sealed, tied, or blocked during surgery. This stops the egg from traveling to the uterus.  The doctor places a small coil or insert into each fallopian tube. This causes scar tissue to form and blocks the fallopian tubes.  When the female has the tubes that carry sperm tied off (vasectomy). Natural family planning birth control  Natural family planning means not having sex or using barrier birth control on the days the woman could become pregnant.  Use a calendar to keep track of the length of each period and know the days she can get pregnant.  Avoid sex during ovulation.  Use a thermometer to measure body temperature. Also watch for symptoms of ovulation.  Time sex to be after  the woman has ovulated. Use condoms to help protect yourself against sexually transmitted infections (STIs). Do this no matter what type of birth control you use. Talk to your doctor about which type of birth control is best for you. This information is not intended to replace advice given to you by your health care provider. Make sure you discuss any questions you have with your health care provider. Document Released: 07/19/2009 Document Revised: 02/27/2016 Document Reviewed: 04/12/2013 Elsevier Interactive Patient Education  2017 [employer]

## 2016-08-20 NOTE — Progress Notes (Signed)
Chief Complaint  Patient presents with  . Contraception     pap smear/ patient didn't change she is still on a cycle    HPI Pt reports that she was previously on Depo and stopped a year ago. She reports that she tried the pill in the past and her periods were heavy. She reports that she has heard horror stories about the Mirena Patient's last menstrual period was 08/19/2016. She is a nonsmoker No history of migraine or hypertension  She reports that she has had some irregularities in her periods and has been having 2 periods in a month but now her periods are become 21-35 day period. She is married and would like to have  Past Medical History:  Diagnosis Date  . Abnormal Pap smear   . Anxiety   . Asthma    albuterol inhaler  . Gonorrhea 2007  . Ovarian cyst   . Urinary tract infection     Current Outpatient Prescriptions  Medication Sig Dispense Refill  . phentermine 15 MG capsule Take 15 mg by mouth every morning.    . budesonide-formoterol (SYMBICORT) 160-4.5 MCG/ACT inhaler Inhale 2 puffs into the lungs 2 (two) times daily. 1 Inhaler 11  . Norgestimate-Ethinyl Estradiol Triphasic (ORTHO TRI-CYCLEN, 28,) 0.18/0.215/0.25 MG-35 MCG tablet Take 1 tablet by mouth daily. 1 Package 11   No current facility-administered medications for this visit.     Allergies:  Allergies  Allergen Reactions  . Aspirin Anaphylaxis    Pt says she can take ibuprofen  . Mushroom Extract Complex Swelling and Rash    Past Surgical History:  Procedure Laterality Date  . CESAREAN SECTION    . CESAREAN SECTION  07/29/2011   Procedure: CESAREAN SECTION;  Surgeon: Almon HerculesKendra H. Ross;  Location: [facility];  Service: Gynecology;  Laterality: N/A;  Repeat  . CHOLECYSTECTOMY    . DILATION AND CURETTAGE OF UTERUS    . mab     x 1 - no surgery required 2009  . WISDOM TOOTH EXTRACTION      Social History   Social History  . Marital status: Married    Spouse name: N/A  . Number of children: N/A  .  Years of education: N/A   Social History Main Topics  . Smoking status: Never Smoker  . Smokeless tobacco: Never Used  . Alcohol use 0.0 oz/week    1 - 3 Cans of beer per week     Comment: socially  . Drug use: No  . Sexual activity: Yes    Birth control/ protection: None, Injection     Comment: pregnant   Other Topics Concern  . None   Social History Narrative  . None    ROS  Objective: Vitals:   08/20/16 1052  BP: 110/74  Pulse: 87  Resp: 18  Temp: 98.1 F (36.7 C)  TempSrc: Oral  SpO2: 99%  Weight: 207 lb (93.9 kg)  Height: 5' 11.75" (1.822 m)    Physical Exam  Constitutional: She is oriented to person, place, and time. She appears well-developed and well-nourished.  HENT:  Head: Normocephalic and atraumatic.  Nose: Nose normal.  Mouth/Throat: Oropharynx is clear and moist.  Eyes: Conjunctivae and EOM are normal.  Cardiovascular: Normal rate, regular rhythm and normal heart sounds.   Pulmonary/Chest: Effort normal and breath sounds normal. No respiratory distress. She has no wheezes.  Neurological: She is alert and oriented to person, place, and time.    Assessment and Plan Nichole Kirk was seen today for contraception.  Diagnoses and all orders for this visit:  Encounter for initial prescription of contraceptive pills- hcg negative Discussed risks and benefits Gave hand out  -     POCT urine pregnancy -     Norgestimate-Ethinyl Estradiol Triphasic (ORTHO TRI-CYCLEN, 28,) 0.18/0.215/0.25 MG-35 MCG tablet; Take 1 tablet by mouth daily.     Nichole Kirk

## 2016-10-27 ENCOUNTER — Ambulatory Visit: Payer: Commercial Managed Care - HMO

## 2017-09-30 ENCOUNTER — Encounter (HOSPITAL_COMMUNITY): Payer: Self-pay | Admitting: *Deleted

## 2017-09-30 ENCOUNTER — Inpatient Hospital Stay (HOSPITAL_COMMUNITY)
Admission: AD | Admit: 2017-09-30 | Discharge: 2017-10-01 | Disposition: A | Discharge status: Home / Self Care | Payer: Medicaid Other | Source: Ambulatory Visit | Attending: Family Medicine | Admitting: Family Medicine

## 2017-09-30 DIAGNOSIS — R1030 Lower abdominal pain, unspecified: Secondary | ICD-10-CM | POA: Insufficient documentation

## 2017-09-30 DIAGNOSIS — W109XXA Fall (on) (from) unspecified stairs and steps, initial encounter: Secondary | ICD-10-CM | POA: Diagnosis not present

## 2017-09-30 DIAGNOSIS — O23591 Infection of other part of genital tract in pregnancy, first trimester: Secondary | ICD-10-CM | POA: Insufficient documentation

## 2017-09-30 DIAGNOSIS — R8271 Bacteriuria: Secondary | ICD-10-CM | POA: Insufficient documentation

## 2017-09-30 DIAGNOSIS — Z886 Allergy status to analgesic agent status: Secondary | ICD-10-CM | POA: Diagnosis not present

## 2017-09-30 DIAGNOSIS — N760 Acute vaginitis: Secondary | ICD-10-CM

## 2017-09-30 DIAGNOSIS — Z79899 Other long term (current) drug therapy: Secondary | ICD-10-CM | POA: Diagnosis not present

## 2017-09-30 DIAGNOSIS — M545 Low back pain: Secondary | ICD-10-CM | POA: Insufficient documentation

## 2017-09-30 DIAGNOSIS — O3680X0 Pregnancy with inconclusive fetal viability, not applicable or unspecified: Secondary | ICD-10-CM

## 2017-09-30 DIAGNOSIS — B9689 Other specified bacterial agents as the cause of diseases classified elsewhere: Secondary | ICD-10-CM | POA: Diagnosis not present

## 2017-09-30 DIAGNOSIS — O26891 Other specified pregnancy related conditions, first trimester: Secondary | ICD-10-CM | POA: Diagnosis not present

## 2017-09-30 DIAGNOSIS — R109 Unspecified abdominal pain: Secondary | ICD-10-CM

## 2017-09-30 DIAGNOSIS — O9989 Other specified diseases and conditions complicating pregnancy, childbirth and the puerperium: Secondary | ICD-10-CM | POA: Insufficient documentation

## 2017-09-30 DIAGNOSIS — Z3A01 Less than 8 weeks gestation of pregnancy: Secondary | ICD-10-CM | POA: Diagnosis not present

## 2017-09-30 DIAGNOSIS — O99511 Diseases of the respiratory system complicating pregnancy, first trimester: Secondary | ICD-10-CM | POA: Insufficient documentation

## 2017-09-30 DIAGNOSIS — Z87440 Personal history of urinary (tract) infections: Secondary | ICD-10-CM | POA: Diagnosis not present

## 2017-09-30 DIAGNOSIS — O99891 Other specified diseases and conditions complicating pregnancy: Secondary | ICD-10-CM

## 2017-09-30 DIAGNOSIS — J45909 Unspecified asthma, uncomplicated: Secondary | ICD-10-CM | POA: Diagnosis not present

## 2017-09-30 DIAGNOSIS — R102 Pelvic and perineal pain: Secondary | ICD-10-CM | POA: Diagnosis present

## 2017-09-30 LAB — URINALYSIS, ROUTINE W REFLEX MICROSCOPIC
Bilirubin Urine: NEGATIVE
Glucose, UA: NEGATIVE mg/dL
Hgb urine dipstick: NEGATIVE
Ketones, ur: NEGATIVE mg/dL
Nitrite: POSITIVE — AB
PROTEIN: 30 mg/dL — AB
Specific Gravity, Urine: 1.024 (ref 1.005–1.030)
pH: 7 (ref 5.0–8.0)

## 2017-09-30 LAB — POCT PREGNANCY, URINE: Preg Test, Ur: POSITIVE — AB

## 2017-09-30 NOTE — MAU Note (Signed)
PT SAYS SHE DID HPT- ON 12-20 -  ALL POSITIVE.         YESTERDAY  WAS CLEANING- MISSED A STEP - FELL  DOWN 7 STEPS .   SINCE  THEN HER PAIN -  IN HER PELVIC  AREA .     LAST SEX- 12-21.

## 2017-10-01 ENCOUNTER — Encounter (HOSPITAL_COMMUNITY): Payer: Self-pay | Admitting: *Deleted

## 2017-10-01 ENCOUNTER — Inpatient Hospital Stay (HOSPITAL_COMMUNITY): Payer: Medicaid Other

## 2017-10-01 DIAGNOSIS — O26891 Other specified pregnancy related conditions, first trimester: Secondary | ICD-10-CM

## 2017-10-01 DIAGNOSIS — R109 Unspecified abdominal pain: Secondary | ICD-10-CM

## 2017-10-01 LAB — WET PREP, GENITAL
Sperm: NONE SEEN
TRICH WET PREP: NONE SEEN
YEAST WET PREP: NONE SEEN

## 2017-10-01 LAB — HCG, QUANTITATIVE, PREGNANCY: hCG, Beta Chain, Quant, S: 970 m[IU]/mL — ABNORMAL HIGH (ref <5)

## 2017-10-01 MED ORDER — CYCLOBENZAPRINE HCL 10 MG PO TABS: 10.0000 mg | ORAL_TABLET | Freq: Three times a day (TID) | ORAL | 0 refills | Status: DC | PRN

## 2017-10-01 MED ORDER — NITROFURANTOIN MONOHYD MACRO 100 MG PO CAPS
100.0000 mg | ORAL_CAPSULE | Freq: Two times a day (BID) | ORAL | 0 refills | Status: AC
Start: 2017-10-01 — End: 2017-10-08

## 2017-10-01 MED ORDER — METRONIDAZOLE 500 MG PO TABS: 500.0000 mg | ORAL_TABLET | Freq: Two times a day (BID) | ORAL | 0 refills | Status: AC

## 2017-10-01 MED ORDER — TRAMADOL HCL 50 MG PO TABS
50.0000 mg | ORAL_TABLET | Freq: Once | ORAL | Status: AC
Start: 2017-10-01 — End: 2017-10-01
  Administered 2017-10-01: 50 mg via ORAL
  Filled 2017-10-01: qty 1

## 2017-10-01 NOTE — Discharge Instructions (Signed)
SAFE MEDICATIONS IN PREGNANCY  Acne:  Benzoyl Peroxide  Salicylic Acid   Pain/Headache Tylenol (acetominophen): 3 regular strength every 6 hours OR        2 Extra strength every 6 hours   Colds/Coughs/Allergies:  Benadryl (alcohol free) 25 mg every 6 hours as needed  Breath right strips  Claritin  Cepacol throat lozenges  Chloraseptic throat spray  Cold-Eeze- up to three times per day  Cough drops, alcohol free  Flonase (by prescription only)  Guaifenesin  Mucinex  Robitussin DM (plain only, alcohol free)  Saline nasal spray/drops  Sudafed (pseudoephedrine) & Actifed * use only after [redacted] weeks gestation and if you do not have high blood pressure  Tylenol  Vicks Vaporub  Zinc lozenges  Zyrtec   Constipation:  Colace  Ducolax suppositories  Fleet enema  Glycerin suppositories  Metamucil  Milk of magnesia  Miralax  Senokot  Smooth move tea   Diarrhea:  Kaopectate  Imodium A-D   *NO pepto Bismol   Hemorrhoids:  Anusol  Anusol HC  Preparation H  Tucks   Indigestion:  Tums  Maalox  Mylanta  Zantac  Pepcid   Insomnia:  Benadryl (alcohol free) 25mg  every 6 hours as needed  Tylenol PM  Unisom, no Gelcaps   Leg Cramps:  Tums  MagGel   Nausea/Vomiting:  Bonine  Dramamine  Emetrol  Ginger extract  Sea bands  Meclizine  Nausea medication to take during pregnancy:  Unisom (doxylamine succinate 25 mg tablets) Take one tablet daily at bedtime. If symptoms are not adequately controlled, the dose can be increased to a maximum recommended dose of two tablets daily (1/2 tablet in the morning, 1/2 tablet mid-afternoon and one at bedtime).  Vitamin B6 100mg  tablets. Take one tablet twice a day (up to 200 mg per day).   Skin Rashes:  Aveeno products  Benadryl cream or 25mg  every 6 hours as needed  Calamine Lotion  1% cortisone cream   Yeast infection:  Gyne-lotrimin 7  Monistat 7   **If taking multiple medications, please check labels to avoid  duplicating the same active ingredients  **take medication as directed on the label  ** Do not exceed 4000 mg of tylenol in 24 hours  **Do not take medications that contain aspirin or ibuprofen

## 2017-10-01 NOTE — MAU Provider Note (Signed)
History  CSN: [id number] Arrival date and time: 09/30/17 2008  First Provider Initiated Contact with Patient 10/01/17 0210      Chief Complaint  Patient presents with  . Pelvic Pain    HPI: Nichole Kirk is a 36 y.o. [phone] with IUP at 3971w5d by LMP who presents to maternity admissions reporting lower abdominal pain. Reports that she fell down 7 steps yesterday, and since then has had severe lower abdominal pain. Also has some lower back pain. She has not taken anything for this at home. Denies any vaginal bleeding or abnormal vaginal discharge. Also denies dysuria, hematuria, HA, dizziness, N/V, or fever/chills, but endorses some urinary frequency.   Past obstetric history: OB History  Gravida Para Term Preterm AB Living  5 2 2  0 2 3  SAB TAB Ectopic Multiple Live Births  1 1 0 1 2    # Outcome Date GA Lbr Len/2nd Weight Sex Delivery Anes PTL Lv  5 Current           4 Term 07/29/11 8673w1d  8 lb 3.8 oz (3.737 kg) M CS-LTranv Spinal  LIV  3 Term 09/05/09    M CS-LTranv Spinal N LIV     Birth Comments: twins  2 TAB           1 SAB               Past Medical History:  Diagnosis Date  . Abnormal Pap smear   . Anxiety   . Asthma    albuterol inhaler  . Gonorrhea 2007  . Ovarian cyst   . Urinary tract infection    Past Surgical History:  Procedure Laterality Date  . CESAREAN SECTION    . CESAREAN SECTION  07/29/2011   Procedure: CESAREAN SECTION;  Surgeon: Almon HerculesKendra H. Ross;  Location: [facility];  Service: Gynecology;  Laterality: N/A;  Repeat  . CHOLECYSTECTOMY    . DILATION AND CURETTAGE OF UTERUS    . mab     x 1 - no surgery required 2009  . WISDOM TOOTH EXTRACTION     Social History   Socioeconomic History  . Marital status: Divorced    Spouse name: Not on file  . Number of children: Not on file  . Years of education: Not on file  . Highest education level: Not on file  Social Needs  . Financial resource strain: Not on file  . Food insecurity - worry: Not on  file  . Food insecurity - inability: Not on file  . Transportation needs - medical: Not on file  . Transportation needs - non-medical: Not on file  Occupational History  . Not on file  Tobacco Use  . Smoking status: Never Smoker  . Smokeless tobacco: Never Used  Substance and Sexual Activity  . Alcohol use: No    Alcohol/week: 0.0 oz    Types: 1 - 3 Cans of beer per week    Frequency: Never    Comment: socially  . Drug use: No  . Sexual activity: Yes    Birth control/protection: None, Injection    Comment: pregnant  Other Topics Concern  . Not on file  Social History Narrative  . Not on file   Allergies  Allergen Reactions  . Aspirin Anaphylaxis    Pt says she can take ibuprofen  . Mushroom Extract Complex Swelling and Rash    Medications Prior to Admission  Medication Sig Dispense Refill Last Dose  . budesonide-formoterol (SYMBICORT) 160-4.5 MCG/ACT inhaler  Inhale 2 puffs into the lungs 2 (two) times daily. 1 Inhaler 11 09/30/2017 at Unknown time  . Norgestimate-Ethinyl Estradiol Triphasic (ORTHO TRI-CYCLEN, 28,) 0.18/0.215/0.25 MG-35 MCG tablet Take 1 tablet by mouth daily. 1 Package 11   . phentermine 15 MG capsule Take 15 mg by mouth every morning.       I have reviewed patient's Past Medical Hx, Surgical Hx, Family Hx, Social Hx, medications and allergies.   Review of Systems - Negative except for what is mentioned in HPI.  Physical Exam   Blood pressure 103/61, pulse 88, temperature 98.4 F (36.9 C), temperature source Oral, resp. rate 20, height 5' 11.75" (1.822 m), weight 228 lb (103.4 kg), last menstrual period 08/22/2017.  Constitutional: Well-developed, well-nourished female, tearful, othwerwise NAD  HENT: George/AT, MMM Eyes: normal conjunctivae, no scleral icterus Cardiovascular: normal rate Respiratory: normal effort GI: Abd soft, non-tender, non-distended  Pelvic: NEFG. Normal vaginal mucosa without lesions; cervix pink, visually closed, without lesion;  moderate amount of whitish discharge. Bimanual exam with diffuse tenderness and difficult to perform; no palpable masses  MSK: Extremities nontender. Lumbar paraspinal muscle tenderness Neurologic: Alert and oriented x 4. Psych: Tearful Skin: warm and dry   MAU Course/MDM:   Nursing notes and VS reviewed. Patient seen and examined, as noted above.   Cultures collected to r/o pelvic infection Labs ordered: Quant HCG Ultrasound to r/o ectopic.   Results reviewed:  Results for orders placed or performed during the hospital encounter of 09/30/17  Wet prep, genital  Result Value Ref Range   Yeast Wet Prep HPF POC NONE SEEN NONE SEEN   Trich, Wet Prep NONE SEEN NONE SEEN   Clue Cells Wet Prep HPF POC PRESENT (A) NONE SEEN   WBC, Wet Prep HPF POC FEW (A) NONE SEEN   Sperm NONE SEEN   Urinalysis, Routine w reflex microscopic  Result Value Ref Range   Color, Urine YELLOW YELLOW   APPearance HAZY (A) CLEAR   Specific Gravity, Urine 1.024 1.005 - 1.030   pH 7.0 5.0 - 8.0   Glucose, UA NEGATIVE NEGATIVE mg/dL   Hgb urine dipstick NEGATIVE NEGATIVE   Bilirubin Urine NEGATIVE NEGATIVE   Ketones, ur NEGATIVE NEGATIVE mg/dL   Protein, ur 30 (A) NEGATIVE mg/dL   Nitrite POSITIVE (A) NEGATIVE   Leukocytes, UA SMALL (A) NEGATIVE   RBC / HPF 0-5 0 - 5 RBC/hpf   WBC, UA 6-30 0 - 5 WBC/hpf   Bacteria, UA RARE (A) NONE SEEN   Squamous Epithelial / LPF 0-5 (A) NONE SEEN   Mucus PRESENT   hCG, quantitative, pregnancy  Result Value Ref Range   hCG, Beta Chain, Quant, S 970 (H) <5 mIU/mL  Pregnancy, urine POC  Result Value Ref Range   Preg Test, Ur POSITIVE (A) NEGATIVE   US OB Transvaginal CLINICAL DATA:  36 year old pregnant female with pelvic pain. LMP: 08/22/2017 corresponding to an estimated gestational age of 05/29/2018.  EXAM: OBSTETRIC <14 WK US AND TRANSVAGINAL OB US  TECHNIQUE: Both transabdominal and transvaginal ultrasound examinations were performed for complete  evaluation of the gestation as well as the maternal uterus, adnexal regions, and pelvic cul-de-sac. Transvaginal technique was performed to assess early pregnancy.  COMPARISON:  None.  FINDINGS: There is a small sac-like structure in the fundal endometrium. No fetal pole or yolk sac identified within this structure. This may represent an early gestational sac, a blighted ovum, or an endometrial cyst. Pseudo gestational of an ectopic pregnancy is not entirely excluded. Correlation  with clinical exam and follow-up with serial HCG levels and ultrasound recommended.  If this cystic structure is a true gestational sac the estimated gestational age based on mean sac diameter of 2 mm is 4 weeks, 6 days.  Subchorionic hemorrhage:  None visualized.  Maternal uterus/adnexae: The maternal ovaries are unremarkable. There is a 1.4 x 1.4 x 1.5 cm complex/hemorrhagic corpus luteum in the left ovary.  There is trace free fluid within the pelvis.  IMPRESSION: Small cystic structure in the fundal endometrium as described. Clinical correlation and follow-up with HCG levels and ultrasound recommended.  Left ovarian corpus luteum.  Electronically Signed   By: Elgie Collard M.D.   On: 10/01/2017 03:02   --Results above reviewed with patient. Discussed ddx, including early pregnancy, failed pregnancy, SAB and ectopic pregnancy. Discussed need for follow up with labs in 48-72 hours.  --UA positive for nitrites. Will send urine for culture, and treat with Macrobid  Assessment and Plan  Assessment: 1. Pregnancy of unknown anatomic location   2. Abdominal pain during pregnancy in first trimester   3. Asymptomatic bacteriuria during pregnancy   4. Bacterial vaginosis     Plan: --Discharge home in stable condition.  --Plan to repeat HCG level in 48 hours in MAU (Sunday morning).  --Will repeat  Ultrasound in about 7-10 days if HCG levels double appropriately  --Ectopic  precautions --Encouraged to return here or to other Urgent Care/ED if she develops worsening of symptoms, increase in pain, fever, or other concerning symptoms.  --Rx for macrobid for UTI/Pyuria --Rx for flagyl for BV.   Marena Witts, Kandra Nicolas, MD 10/01/2017 3:54 AM  Follow-up Information    [facility] [place] [facility] ADMISS[facility] Follow up on 10/03/2017.   Why:  for repeat lab (beta-HCG) Contact information: [address] [id number] [address] [place] [address] [phone]

## 2017-10-03 ENCOUNTER — Inpatient Hospital Stay (HOSPITAL_COMMUNITY)
Admission: AD | Admit: 2017-10-03 | Discharge: 2017-10-03 | Disposition: A | Discharge status: Home / Self Care | Payer: Medicaid Other | Source: Ambulatory Visit | Attending: Obstetrics & Gynecology | Admitting: Obstetrics & Gynecology

## 2017-10-03 DIAGNOSIS — O34219 Maternal care for unspecified type scar from previous cesarean delivery: Secondary | ICD-10-CM | POA: Diagnosis not present

## 2017-10-03 DIAGNOSIS — J45909 Unspecified asthma, uncomplicated: Secondary | ICD-10-CM | POA: Insufficient documentation

## 2017-10-03 DIAGNOSIS — Z886 Allergy status to analgesic agent status: Secondary | ICD-10-CM | POA: Insufficient documentation

## 2017-10-03 DIAGNOSIS — O0281 Inappropriate change in quantitative human chorionic gonadotropin (hCG) in early pregnancy: Secondary | ICD-10-CM | POA: Diagnosis not present

## 2017-10-03 DIAGNOSIS — Z3481 Encounter for supervision of other normal pregnancy, first trimester: Secondary | ICD-10-CM | POA: Diagnosis present

## 2017-10-03 DIAGNOSIS — Z79899 Other long term (current) drug therapy: Secondary | ICD-10-CM | POA: Diagnosis not present

## 2017-10-03 DIAGNOSIS — Z3A01 Less than 8 weeks gestation of pregnancy: Secondary | ICD-10-CM | POA: Insufficient documentation

## 2017-10-03 DIAGNOSIS — Z7951 Long term (current) use of inhaled steroids: Secondary | ICD-10-CM | POA: Insufficient documentation

## 2017-10-03 DIAGNOSIS — O09521 Supervision of elderly multigravida, first trimester: Secondary | ICD-10-CM | POA: Diagnosis not present

## 2017-10-03 DIAGNOSIS — O3680X0 Pregnancy with inconclusive fetal viability, not applicable or unspecified: Secondary | ICD-10-CM

## 2017-10-03 DIAGNOSIS — O99511 Diseases of the respiratory system complicating pregnancy, first trimester: Secondary | ICD-10-CM | POA: Insufficient documentation

## 2017-10-03 LAB — CULTURE, OB URINE: Culture: >=100000 — AB

## 2017-10-03 LAB — HCG, QUANTITATIVE, PREGNANCY: hCG, Beta Chain, Quant, S: 2817 m[IU]/mL — ABNORMAL HIGH (ref <5)

## 2017-10-03 NOTE — MAU Provider Note (Signed)
Chief Complaint: Follow-up Quant   SUBJECTIVE HPI: Nichole Kirk is a 36 y.o. [phone] at 6746w0d by LMP who presents to maternity admissions for repeat HCG labs. She denies abdominal pain, vaginal bleeding, vaginal itching/burning, urinary symptoms, h/a, dizziness, n/v, or fever/chills.    Past Medical History:  Diagnosis Date  . Abnormal Pap smear   . Anxiety   . Asthma    albuterol inhaler  . Gonorrhea 2007  . Ovarian cyst   . Urinary tract infection    Past Surgical History:  Procedure Laterality Date  . CESAREAN SECTION    . CESAREAN SECTION  07/29/2011   Procedure: CESAREAN SECTION;  Surgeon: Almon HerculesKendra H. Ross;  Location: [facility];  Service: Gynecology;  Laterality: N/A;  Repeat  . CHOLECYSTECTOMY    . DILATION AND CURETTAGE OF UTERUS    . mab     x 1 - no surgery required 2009  . WISDOM TOOTH EXTRACTION     Social History   Socioeconomic History  . Marital status: Divorced    Spouse name: Not on file  . Number of children: Not on file  . Years of education: Not on file  . Highest education level: Not on file  Social Needs  . Financial resource strain: Not on file  . Food insecurity - worry: Not on file  . Food insecurity - inability: Not on file  . Transportation needs - medical: Not on file  . Transportation needs - non-medical: Not on file  Occupational History  . Not on file  Tobacco Use  . Smoking status: Never Smoker  . Smokeless tobacco: Never Used  Substance and Sexual Activity  . Alcohol use: No    Alcohol/week: 0.0 oz    Types: 1 - 3 Cans of beer per week    Frequency: Never    Comment: socially  . Drug use: No  . Sexual activity: Yes    Birth control/protection: None, Injection    Comment: pregnant  Other Topics Concern  . Not on file  Social History Narrative  . Not on file   No current facility-administered medications on file prior to encounter.    Current Outpatient Medications on File Prior to Encounter  Medication Sig Dispense Refill  .  budesonide-formoterol (SYMBICORT) 160-4.5 MCG/ACT inhaler Inhale 2 puffs into the lungs 2 (two) times daily. 1 Inhaler 11  . cyclobenzaprine (FLEXERIL) 10 MG tablet Take 1 tablet (10 mg total) by mouth 3 (three) times daily as needed for muscle spasms. 10 tablet 0  . metroNIDAZOLE (FLAGYL) 500 MG tablet Take 1 tablet (500 mg total) by mouth 2 (two) times daily for 7 days. 14 tablet 0  . nitrofurantoin, macrocrystal-monohydrate, (MACROBID) 100 MG capsule Take 1 capsule (100 mg total) by mouth 2 (two) times daily for 7 days. 14 capsule 0   Allergies  Allergen Reactions  . Aspirin Anaphylaxis    Pt says she can take ibuprofen  . Mushroom Extract Complex Swelling and Rash    ROS:  Review of Systems  Constitutional: Negative.   Respiratory: Negative.   Cardiovascular: Negative.   Gastrointestinal: Negative.   Genitourinary: Negative.   Neurological: Negative.   Psychiatric/Behavioral: Negative.    I have reviewed patient's Past Medical Hx, Surgical Hx, Family Hx, Social Hx, medications and allergies.   Physical Exam   Patient Vitals for the past 24 hrs:  BP Temp Pulse Resp  10/03/17 0902 120/70 98.3 F (36.8 C) 84 18   Constitutional: Well-developed, well-nourished female in no  acute distress.  Cardiovascular: normal rate Respiratory: normal effort GI: Abd soft, non-tender. Pos BS x 4 MS: Extremities nontender, no edema, normal ROM Neurologic: Alert and oriented x 4.  GU: Neg CVAT.  LAB RESULTS Results for orders placed or performed during the hospital encounter of 10/03/17 (from the past 24 hour(s))  hCG, quantitative, pregnancy     Status: Abnormal   Collection Time: 10/03/17  9:00 AM  Result Value Ref Range   hCG, Beta Chain, Quant, S 2,817 (H) <5 mIU/mL  HCG- increased from 970 to 2,817      MAU Management/MDM: Orders Placed This Encounter  Procedures  . hCG, quantitative, pregnancy  . Discharge patient Discharge disposition: 01-Home or Self Care; Discharge patient  date: 10/03/2017   Patient discharged with strict ectopic precautions  ASSESSMENT 1. Pregnancy of unknown anatomic location     PLAN Discharge home in stable condition  US repeat in 7 days for viability  Discussed ectopic precautions and to return to MAU with pain and/or vaginal bleeding   Follow-up Information    [facility]   Specialty:  Obstetrics and Gynecology Why:  Follow up as scheduled and return to MAU as needed for pain or bleeding  Contact information: [address] [place] [id number] [phone]          Allergies as of 10/03/2017      Reactions   Aspirin Anaphylaxis   Pt says she can take ibuprofen   Mushroom Extract Complex Swelling, Rash      Medication List    TAKE these medications   budesonide-formoterol 160-4.5 MCG/ACT inhaler Commonly known as:  SYMBICORT Inhale 2 puffs into the lungs 2 (two) times daily.   cyclobenzaprine 10 MG tablet Commonly known as:  FLEXERIL Take 1 tablet (10 mg total) by mouth 3 (three) times daily as needed for muscle spasms.   metroNIDAZOLE 500 MG tablet Commonly known as:  FLAGYL Take 1 tablet (500 mg total) by mouth 2 (two) times daily for 7 days.   nitrofurantoin (macrocrystal-monohydrate) 100 MG capsule Commonly known as:  MACROBID Take 1 capsule (100 mg total) by mouth 2 (two) times daily for 7 days.        Steward DroneVeronica Jeilani Grupe  Certified Nurse-Midwife 10/03/2017  10:39 AM

## 2017-10-03 NOTE — MAU Note (Signed)
Patient here for follow up bloodwork. Denies vaginal bleeding no pain.

## 2017-10-04 LAB — GC/CHLAMYDIA PROBE AMP (~~LOC~~) NOT AT ARMC
Chlamydia: NEGATIVE
Neisseria Gonorrhea: NEGATIVE

## 2017-10-11 ENCOUNTER — Other Ambulatory Visit: Payer: Self-pay | Admitting: Advanced Practice Midwife

## 2017-10-11 DIAGNOSIS — O26891 Other specified pregnancy related conditions, first trimester: Secondary | ICD-10-CM

## 2017-10-11 DIAGNOSIS — R102 Pelvic and perineal pain: Principal | ICD-10-CM

## 2017-10-11 NOTE — Progress Notes (Unsigned)
Here for US.  Never got ordered Will order for this week  Aviva SignsWilliams, Marie L, CNM

## 2017-10-13 ENCOUNTER — Ambulatory Visit (HOSPITAL_COMMUNITY)
Admission: RE | Admit: 2017-10-13 | Discharge: 2017-10-13 | Disposition: A | Discharge status: Home / Self Care | Payer: Medicaid Other | Source: Ambulatory Visit | Attending: Advanced Practice Midwife | Admitting: Advanced Practice Midwife

## 2017-10-13 ENCOUNTER — Ambulatory Visit: Payer: Self-pay | Admitting: General Practice

## 2017-10-13 DIAGNOSIS — Z3A01 Less than 8 weeks gestation of pregnancy: Secondary | ICD-10-CM | POA: Diagnosis not present

## 2017-10-13 DIAGNOSIS — O26891 Other specified pregnancy related conditions, first trimester: Secondary | ICD-10-CM

## 2017-10-13 DIAGNOSIS — R102 Pelvic and perineal pain: Secondary | ICD-10-CM | POA: Insufficient documentation

## 2017-10-13 DIAGNOSIS — Z712 Person consulting for explanation of examination or test findings: Secondary | ICD-10-CM

## 2017-10-13 NOTE — Progress Notes (Signed)
Patient here for viability results today. Reviewed with Nolene Bernheimerri Burleson who finds living IUP, patient should begin prenatal care. Informed patient of results, provided pictures, & dating. Patient verbalized understanding & denies taking any meds only PNV. Patient will start OB care. Patient had no questions

## 2017-10-14 ENCOUNTER — Ambulatory Visit (HOSPITAL_COMMUNITY): Payer: Self-pay

## 2017-11-17 ENCOUNTER — Encounter (HOSPITAL_COMMUNITY): Payer: Self-pay | Admitting: *Deleted

## 2017-11-17 ENCOUNTER — Other Ambulatory Visit: Payer: Self-pay

## 2017-11-17 ENCOUNTER — Other Ambulatory Visit: Payer: Self-pay | Admitting: Obstetrics and Gynecology

## 2017-11-22 ENCOUNTER — Encounter (HOSPITAL_COMMUNITY)
Admission: RE | Disposition: A | Discharge status: Home / Self Care | Payer: Self-pay | Source: Ambulatory Visit | Attending: Obstetrics and Gynecology

## 2017-11-22 ENCOUNTER — Ambulatory Visit (HOSPITAL_COMMUNITY): Payer: Medicaid Other | Admitting: Certified Registered Nurse Anesthetist

## 2017-11-22 ENCOUNTER — Other Ambulatory Visit: Payer: Self-pay | Admitting: Obstetrics and Gynecology

## 2017-11-22 ENCOUNTER — Encounter (HOSPITAL_COMMUNITY): Payer: Self-pay

## 2017-11-22 ENCOUNTER — Ambulatory Visit (HOSPITAL_COMMUNITY): Payer: Medicaid Other

## 2017-11-22 ENCOUNTER — Ambulatory Visit (HOSPITAL_COMMUNITY)
Admission: RE | Admit: 2017-11-22 | Discharge: 2017-11-22 | Disposition: A | Discharge status: Home / Self Care | Payer: Medicaid Other | Source: Ambulatory Visit | Attending: Obstetrics and Gynecology | Admitting: Obstetrics and Gynecology

## 2017-11-22 DIAGNOSIS — O021 Missed abortion: Secondary | ICD-10-CM

## 2017-11-22 DIAGNOSIS — Z6832 Body mass index (BMI) 32.0-32.9, adult: Secondary | ICD-10-CM | POA: Insufficient documentation

## 2017-11-22 DIAGNOSIS — J45909 Unspecified asthma, uncomplicated: Secondary | ICD-10-CM | POA: Insufficient documentation

## 2017-11-22 DIAGNOSIS — F419 Anxiety disorder, unspecified: Secondary | ICD-10-CM | POA: Diagnosis not present

## 2017-11-22 DIAGNOSIS — Y733 Surgical instruments, materials and gastroenterology and urology devices (including sutures) associated with adverse incidents: Secondary | ICD-10-CM

## 2017-11-22 DIAGNOSIS — E785 Hyperlipidemia, unspecified: Secondary | ICD-10-CM | POA: Insufficient documentation

## 2017-11-22 DIAGNOSIS — O039 Complete or unspecified spontaneous abortion without complication: Secondary | ICD-10-CM | POA: Insufficient documentation

## 2017-11-22 DIAGNOSIS — Z79899 Other long term (current) drug therapy: Secondary | ICD-10-CM | POA: Insufficient documentation

## 2017-11-22 DIAGNOSIS — E669 Obesity, unspecified: Secondary | ICD-10-CM | POA: Insufficient documentation

## 2017-11-22 DIAGNOSIS — Z886 Allergy status to analgesic agent status: Secondary | ICD-10-CM | POA: Insufficient documentation

## 2017-11-22 HISTORY — PX: DILATION AND EVACUATION: SHX1459

## 2017-11-22 LAB — TYPE AND SCREEN
ABO/RH(D): A POS
Antibody Screen: NEGATIVE

## 2017-11-22 LAB — CBC
HCT: 36.8 % (ref 36.0–46.0)
Hemoglobin: 12.4 g/dL (ref 12.0–15.0)
MCH: 29.6 pg (ref 26.0–34.0)
MCHC: 33.7 g/dL (ref 30.0–36.0)
MCV: 87.8 fL (ref 78.0–100.0)
PLATELETS: 209 10*3/uL (ref 150–400)
RBC: 4.19 MIL/uL (ref 3.87–5.11)
RDW: 12.6 % (ref 11.5–15.5)
WBC: 10.4 10*3/uL (ref 4.0–10.5)

## 2017-11-22 SURGERY — DILATION AND EVACUATION, UTERUS
Anesthesia: Monitor Anesthesia Care | Site: Vagina

## 2017-11-22 MED ORDER — SCOPOLAMINE 1 MG/3DAYS TD PT72
1.0000 | MEDICATED_PATCH | Freq: Once | TRANSDERMAL | Status: DC
  Administered 2017-11-22: 1.5 mg via TRANSDERMAL

## 2017-11-22 MED ORDER — MEPERIDINE HCL 25 MG/ML IJ SOLN: 6.2500 mg | INTRAMUSCULAR | Status: DC | PRN

## 2017-11-22 MED ORDER — FENTANYL CITRATE (PF) 100 MCG/2ML IJ SOLN
INTRAMUSCULAR | Status: DC | PRN
  Administered 2017-11-22: 100 ug via INTRAVENOUS

## 2017-11-22 MED ORDER — FENTANYL CITRATE (PF) 100 MCG/2ML IJ SOLN
INTRAMUSCULAR | Status: AC
  Filled 2017-11-22: qty 2

## 2017-11-22 MED ORDER — LIDOCAINE HCL 1 % IJ SOLN
INTRAMUSCULAR | Status: DC | PRN
  Administered 2017-11-22: 20 mL

## 2017-11-22 MED ORDER — MIDAZOLAM HCL 2 MG/2ML IJ SOLN
INTRAMUSCULAR | Status: DC | PRN
  Administered 2017-11-22: 2 mg via INTRAVENOUS

## 2017-11-22 MED ORDER — CEFAZOLIN SODIUM-DEXTROSE 2-3 GM-%(50ML) IV SOLR
INTRAVENOUS | Status: DC | PRN
  Administered 2017-11-22: 2 g via INTRAVENOUS

## 2017-11-22 MED ORDER — METOCLOPRAMIDE HCL 5 MG/ML IJ SOLN: 10.0000 mg | Freq: Once | INTRAMUSCULAR | Status: DC | PRN

## 2017-11-22 MED ORDER — OXYCODONE-ACETAMINOPHEN 5-325 MG PO TABS
1.0000 | ORAL_TABLET | ORAL | Status: DC | PRN
  Administered 2017-11-22: 1 via ORAL

## 2017-11-22 MED ORDER — FENTANYL CITRATE (PF) 100 MCG/2ML IJ SOLN: 25.0000 ug | INTRAMUSCULAR | Status: DC | PRN

## 2017-11-22 MED ORDER — ONDANSETRON HCL 4 MG/2ML IJ SOLN
INTRAMUSCULAR | Status: AC
  Filled 2017-11-22: qty 2

## 2017-11-22 MED ORDER — LIDOCAINE HCL 1 % IJ SOLN
INTRAMUSCULAR | Status: AC
  Filled 2017-11-22: qty 20

## 2017-11-22 MED ORDER — LACTATED RINGERS IV SOLN
INTRAVENOUS | Status: DC
  Administered 2017-11-22: 11:00:00 via INTRAVENOUS

## 2017-11-22 MED ORDER — PROPOFOL 500 MG/50ML IV EMUL
INTRAVENOUS | Status: DC | PRN
  Administered 2017-11-22: 150 ug/kg/min via INTRAVENOUS

## 2017-11-22 MED ORDER — DEXAMETHASONE SODIUM PHOSPHATE 10 MG/ML IJ SOLN
INTRAMUSCULAR | Status: DC | PRN
  Administered 2017-11-22: 4 mg via INTRAVENOUS

## 2017-11-22 MED ORDER — SCOPOLAMINE 1 MG/3DAYS TD PT72
MEDICATED_PATCH | TRANSDERMAL | Status: AC
  Administered 2017-11-22: 1.5 mg via TRANSDERMAL
  Filled 2017-11-22: qty 1

## 2017-11-22 MED ORDER — MIDAZOLAM HCL 2 MG/2ML IJ SOLN
INTRAMUSCULAR | Status: AC
  Filled 2017-11-22: qty 2

## 2017-11-22 MED ORDER — OXYCODONE-ACETAMINOPHEN 5-325 MG PO TABS
ORAL_TABLET | ORAL | Status: AC
  Filled 2017-11-22: qty 1

## 2017-11-22 MED ORDER — DEXAMETHASONE SODIUM PHOSPHATE 4 MG/ML IJ SOLN
INTRAMUSCULAR | Status: AC
  Filled 2017-11-22: qty 1

## 2017-11-22 MED ORDER — ONDANSETRON HCL 4 MG/2ML IJ SOLN
INTRAMUSCULAR | Status: DC | PRN
  Administered 2017-11-22: 4 mg via INTRAVENOUS

## 2017-11-22 SURGICAL SUPPLY — 17 items
CATH ROBINSON RED A/P 16FR (CATHETERS) ×2 IMPLANT
DECANTER SPIKE VIAL GLASS SM (MISCELLANEOUS) ×2 IMPLANT
GLOVE BIOGEL PI IND STRL 7.0 (GLOVE) ×1 IMPLANT
GLOVE BIOGEL PI INDICATOR 7.0 (GLOVE) ×1
GLOVE ECLIPSE 7.0 STRL STRAW (GLOVE) ×4 IMPLANT
GOWN STRL REUS W/TWL LRG LVL3 (GOWN DISPOSABLE) ×6 IMPLANT
KIT BERKELEY 1ST TRIMESTER 3/8 (MISCELLANEOUS) ×2 IMPLANT
NS IRRIG 1000ML POUR BTL (IV SOLUTION) ×2 IMPLANT
PACK VAGINAL MINOR WOMEN LF (CUSTOM PROCEDURE TRAY) ×2 IMPLANT
PAD OB MATERNITY 4.3X12.25 (PERSONAL CARE ITEMS) ×2 IMPLANT
PAD PREP 24X48 CUFFED NSTRL (MISCELLANEOUS) ×2 IMPLANT
SET BERKELEY SUCTION TUBING (SUCTIONS) ×2 IMPLANT
TOWEL OR 17X24 6PK STRL BLUE (TOWEL DISPOSABLE) ×4 IMPLANT
VACURETTE 10 RIGID CVD (CANNULA) ×1 IMPLANT
VACURETTE 7MM CVD STRL WRAP (CANNULA) IMPLANT
VACURETTE 8 RIGID CVD (CANNULA) IMPLANT
VACURETTE 9 RIGID CVD (CANNULA) IMPLANT

## 2017-11-22 NOTE — Anesthesia Preprocedure Evaluation (Signed)
Anesthesia Evaluation  Patient identified by MRN, date of birth, ID band Patient awake    Reviewed: Allergy & Precautions, NPO status , Patient's Chart, lab work & pertinent test results  Airway Mallampati: II  TM Distance: >3 FB Neck ROM: Full    Dental no notable dental hx. (+) Teeth Intact   Pulmonary asthma ,    Pulmonary exam normal breath sounds clear to auscultation       Cardiovascular negative cardio ROS Normal cardiovascular exam Rhythm:Regular Rate:Normal     Neuro/Psych PSYCHIATRIC DISORDERS Anxiety negative neurological ROS     GI/Hepatic negative GI ROS, Neg liver ROS,   Endo/Other  Hyperlipidemia Obesity  Renal/GU negative Renal ROS  negative genitourinary   Musculoskeletal negative musculoskeletal ROS (+)   Abdominal (+) + obese,   Peds  Hematology   Anesthesia Other Findings   Reproductive/Obstetrics (+) Pregnancy Missed Ab                             Anesthesia Physical Anesthesia Plan  ASA: II  Anesthesia Plan: MAC   Post-op Pain Management:    Induction: Intravenous  PONV Risk Score and Plan: 3  Airway Management Planned: Mask  Additional Equipment:   Intra-op Plan:   Post-operative Plan:   Informed Consent: I have reviewed the patients History and Physical, chart, labs and discussed the procedure including the risks, benefits and alternatives for the proposed anesthesia with the patient or authorized representative who has indicated his/her understanding and acceptance.   Dental advisory given  Plan Discussed with: CRNA, Anesthesiologist and Surgeon  Anesthesia Plan Comments:         Anesthesia Quick Evaluation

## 2017-11-22 NOTE — H&P (Signed)
Pt is an 37 y.o. [id number] 3698w1d black female with a SAB. Pt with h.o. infertilty. Chief Complaint: HPI:  Past Medical History:  Diagnosis Date  . Abnormal Pap smear   . Anxiety 2012   no meds   . Asthma    albuterol inhaler  . Gonorrhea 2007  . Ovarian cyst   . Urinary tract infection     Past Surgical History:  Procedure Laterality Date  . CESAREAN SECTION  2010  . CESAREAN SECTION  07/29/2011   Procedure: CESAREAN SECTION;  Surgeon: Almon HerculesKendra H. Ross;  Location: [facility];  Service: Gynecology;  Laterality: N/A;  Repeat  . CHOLECYSTECTOMY    . DILATION AND CURETTAGE OF UTERUS    . mab     x 1 - no surgery required 2009  . WISDOM TOOTH EXTRACTION      Family History  Problem Relation Age of Onset  . Cancer Mother        Breast  . Diabetes Mother   . Diabetes Maternal Grandfather   . Cancer Maternal Grandmother        Breast  . Lupus Maternal Grandmother   . Cancer Father   . Multiple sclerosis Sister   . Cancer Paternal Aunt        breast  . Cancer Paternal Aunt        breast  . Cancer Paternal Aunt        ovarian   Social History:  reports that  has never smoked. she has never used smokeless tobacco. She reports that she does not drink alcohol or use drugs.  Allergies:  Allergies  Allergen Reactions  . Aspirin Anaphylaxis    Pt says she can take ibuprofen  . Mushroom Extract Complex Swelling and Rash    Medications Prior to Admission  Medication Sig Dispense Refill  . albuterol (PROVENTIL HFA;VENTOLIN HFA) 108 (90 Base) MCG/ACT inhaler Inhale 1-2 puffs into the lungs every 6 (six) hours as needed for wheezing or shortness of breath.    . budesonide-formoterol (SYMBICORT) 160-4.5 MCG/ACT inhaler Inhale 2 puffs into the lungs 2 (two) times daily. 1 Inhaler 11  . cyclobenzaprine (FLEXERIL) 10 MG tablet Take 1 tablet (10 mg total) by mouth 3 (three) times daily as needed for muscle spasms. (Patient not taking: Reported on 11/18/2017) 10 tablet 0       Blood  pressure 110/78, pulse 81, temperature 98.2 F (36.8 C), temperature source Oral, resp. rate 18, height 5\' 11"  (1.803 m), weight 234 lb (106.1 kg), last menstrual period 08/22/2017, SpO2 100 %. General appearance: alert and cooperative Abdomen: soft, non-tender; bowel sounds normal; no masses,  no organomegaly   Lab Results  Component Value Date   WBC 10.4 11/22/2017   HGB 12.4 11/22/2017   HCT 36.8 11/22/2017   MCV 87.8 11/22/2017   PLT 209 11/22/2017   Lab Results  Component Value Date   PREGTESTUR POSITIVE (A) 09/30/2017    Patient Active Problem List   Diagnosis Date Noted  . 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 HYPERLIPIDEMIA 06/17/2010  . ASTHMA 06/17/2010   IMP/ SAB Plan/ Proceed with D&E  Hanya Guerin E 11/22/2017, 11:29 AM

## 2017-11-22 NOTE — Transfer of Care (Signed)
Immediate Anesthesia Transfer of Care Note  Patient: Nichole Kirk  Procedure(s) Performed: DILATATION AND EVACUATION (N/A Vagina )  Patient Location: PACU  Anesthesia Type:MAC  Level of Consciousness: awake, alert  and oriented  Airway & Oxygen Therapy: Patient Spontanous Breathing and Patient connected to face mask oxygen  Post-op Assessment: Report given to RN and Patient moving all extremities X 4  Post vital signs: Reviewed and stable  Last Vitals:  Vitals:   11/22/17 1024  BP: 110/78  Pulse: 81  Resp: 18  Temp: 36.8 C  SpO2: 100%    Last Pain:  Vitals:   11/22/17 1024  TempSrc: Oral      Patients Stated Pain Goal: 3 (23/95/32 0233)  Complications: No apparent anesthesia complications

## 2017-11-22 NOTE — Discharge Instructions (Signed)

## 2017-11-22 NOTE — Anesthesia Postprocedure Evaluation (Signed)
Anesthesia Post Note  Patient: Nichole Kirk  Procedure(s) Performed: DILATATION AND EVACUATION (N/A Vagina )     Patient location during evaluation: PACU Anesthesia Type: MAC Level of consciousness: awake and alert and oriented Pain management: pain level controlled Vital Signs Assessment: post-procedure vital signs reviewed and stable Respiratory status: spontaneous breathing, nonlabored ventilation and respiratory function stable Cardiovascular status: blood pressure returned to baseline and stable Postop Assessment: no apparent nausea or vomiting Anesthetic complications: no    Last Vitals:  Vitals:   11/22/17 1245 11/22/17 1257  BP: 107/75 107/64  Pulse: 79 67  Resp: 18 16  Temp:    SpO2: 100% 100%    Last Pain:  Vitals:   11/22/17 1245  TempSrc:   PainSc: 0-No pain   Pain Goal: Patients Stated Pain Goal: 3 (11/22/17 1024)               Bralynn Donado A.

## 2017-11-23 ENCOUNTER — Encounter (HOSPITAL_COMMUNITY): Payer: Self-pay | Admitting: Obstetrics and Gynecology

## 2017-11-23 NOTE — Op Note (Signed)
NAMEKEILLY, FATULA NO.:  [id number]  MEDICAL RECORD NO.:  [id number]  LOCATION:                                 FACILITY:  PHYSICIAN:  Freda Munro, M.D.         DATE OF BIRTH:  DATE OF PROCEDURE:  11/22/2017 DATE OF DISCHARGE:                              OPERATIVE REPORT   PREOPERATIVE DIAGNOSIS:  Spontaneous abortion.  POSTOPERATIVE DIAGNOSIS:  Spontaneous abortion.  PROCEDURE:  Dilation and evacuation.  SURGEON:  Freda Munro, M.D.  ANESTHESIA:  MAC with local.  ANTIBIOTICS:  Ancef 2 g.  DRAINAGE:  Red rubber catheter to bladder.  SPECIMENS:  Products of conception sent to Pathology.  COMPLICATIONS:  None.  ESTIMATED BLOOD LOSS:  35 mL.  PROCEDURE IN DETAIL:  The patient was taken to the operating room.  She was placed in a dorsal supine position.  MAC anesthesia was administered without difficulty.  She was then placed in dorsal lithotomy position. She was prepped and draped in the usual fashion for this procedure.  A sterile speculum was placed in the vagina.  20 mL of 1% lidocaine was placed for paracervical block.  Single-tooth tenaculum was applied to the anterior cervical lip.  The cervix was serially dilated to a 45- [place].  A 10 mm suction cannula was placed into the uterine cavity and products of conception withdrawn.  Sharp curettage was performed followed by repeat suction.  The patient tolerated the procedure well. She was taken to the recovery room in stable condition.  Instrument and lap counts were correct x2.          ______________________________ Freda Munro, M.D.    MA/MEDQ  D:  11/22/2017  T:  11/22/2017  Job:  [id number]

## 2018-02-14 ENCOUNTER — Ambulatory Visit: Payer: Medicaid Other | Admitting: Family Medicine

## 2018-03-22 ENCOUNTER — Other Ambulatory Visit: Payer: Self-pay | Admitting: Physician Assistant

## 2018-03-22 DIAGNOSIS — Z1231 Encounter for screening mammogram for malignant neoplasm of breast: Secondary | ICD-10-CM

## 2018-04-11 ENCOUNTER — Ambulatory Visit
Admission: RE | Admit: 2018-04-11 | Discharge: 2018-04-11 | Disposition: A | Discharge status: Home / Self Care | Payer: Medicaid Other | Source: Ambulatory Visit | Attending: Physician Assistant | Admitting: Physician Assistant

## 2018-04-11 DIAGNOSIS — Z1231 Encounter for screening mammogram for malignant neoplasm of breast: Secondary | ICD-10-CM

## 2018-04-13 ENCOUNTER — Other Ambulatory Visit: Payer: Self-pay | Admitting: Physician Assistant

## 2018-04-13 DIAGNOSIS — R928 Other abnormal and inconclusive findings on diagnostic imaging of breast: Secondary | ICD-10-CM

## 2018-04-19 ENCOUNTER — Ambulatory Visit
Admission: RE | Admit: 2018-04-19 | Discharge: 2018-04-19 | Disposition: A | Discharge status: Home / Self Care | Payer: Self-pay | Source: Ambulatory Visit | Attending: Physician Assistant | Admitting: Physician Assistant

## 2018-04-19 ENCOUNTER — Other Ambulatory Visit: Payer: Self-pay | Admitting: Physician Assistant

## 2018-04-19 ENCOUNTER — Ambulatory Visit
Admission: RE | Admit: 2018-04-19 | Discharge: 2018-04-19 | Disposition: A | Discharge status: Home / Self Care | Payer: Medicaid Other | Source: Ambulatory Visit | Attending: Physician Assistant | Admitting: Physician Assistant

## 2018-04-19 DIAGNOSIS — N6310 Unspecified lump in the right breast, unspecified quadrant: Secondary | ICD-10-CM

## 2018-04-19 DIAGNOSIS — R928 Other abnormal and inconclusive findings on diagnostic imaging of breast: Secondary | ICD-10-CM

## 2018-10-21 ENCOUNTER — Other Ambulatory Visit: Payer: Self-pay | Admitting: Physician Assistant

## 2018-10-21 ENCOUNTER — Ambulatory Visit
Admission: RE | Admit: 2018-10-21 | Discharge: 2018-10-21 | Disposition: A | Discharge status: Home / Self Care | Payer: Medicaid Other | Source: Ambulatory Visit | Attending: Physician Assistant | Admitting: Physician Assistant

## 2018-10-21 DIAGNOSIS — N6310 Unspecified lump in the right breast, unspecified quadrant: Secondary | ICD-10-CM

## 2018-11-05 ENCOUNTER — Inpatient Hospital Stay (HOSPITAL_COMMUNITY): Payer: Medicaid Other

## 2018-11-05 ENCOUNTER — Inpatient Hospital Stay (HOSPITAL_COMMUNITY)
Admission: AD | Admit: 2018-11-05 | Discharge: 2018-11-05 | Disposition: A | Discharge status: Home / Self Care | Payer: Medicaid Other | Source: Ambulatory Visit | Attending: Obstetrics and Gynecology | Admitting: Obstetrics and Gynecology

## 2018-11-05 ENCOUNTER — Encounter (HOSPITAL_COMMUNITY): Payer: Self-pay

## 2018-11-05 DIAGNOSIS — O021 Missed abortion: Secondary | ICD-10-CM

## 2018-11-05 DIAGNOSIS — R109 Unspecified abdominal pain: Secondary | ICD-10-CM

## 2018-11-05 DIAGNOSIS — O26899 Other specified pregnancy related conditions, unspecified trimester: Secondary | ICD-10-CM

## 2018-11-05 DIAGNOSIS — O074 Failed attempted termination of pregnancy without complication: Secondary | ICD-10-CM

## 2018-11-05 DIAGNOSIS — O209 Hemorrhage in early pregnancy, unspecified: Secondary | ICD-10-CM | POA: Diagnosis not present

## 2018-11-05 LAB — HCG, QUANTITATIVE, PREGNANCY: hCG, Beta Chain, Quant, S: 188 m[IU]/mL — ABNORMAL HIGH (ref <5)

## 2018-11-05 LAB — CBC WITH DIFFERENTIAL/PLATELET
BASOS ABS: 0 10*3/uL (ref 0.0–0.1)
Basophils Relative: 0 %
EOS PCT: 1 %
Eosinophils Absolute: 0.1 10*3/uL (ref 0.0–0.5)
HCT: 35.6 % — ABNORMAL LOW (ref 36.0–46.0)
HEMOGLOBIN: 11.7 g/dL — AB (ref 12.0–15.0)
LYMPHS PCT: 26 %
Lymphs Abs: 2.8 10*3/uL (ref 0.7–4.0)
MCH: 29.2 pg (ref 26.0–34.0)
MCHC: 32.9 g/dL (ref 30.0–36.0)
MCV: 88.8 fL (ref 80.0–100.0)
Monocytes Absolute: 0.4 10*3/uL (ref 0.1–1.0)
Monocytes Relative: 4 %
NEUTROS ABS: 7.4 10*3/uL (ref 1.7–7.7)
NEUTROS PCT: 69 %
NRBC: 0 % (ref 0.0–0.2)
PLATELETS: 209 10*3/uL (ref 150–400)
RBC: 4.01 MIL/uL (ref 3.87–5.11)
RDW: 12.3 % (ref 11.5–15.5)
WBC: 10.7 10*3/uL — AB (ref 4.0–10.5)

## 2018-11-05 MED ORDER — LACTATED RINGERS IV BOLUS
1000.0000 mL | Freq: Once | INTRAVENOUS | Status: AC
  Administered 2018-11-05: 1000 mL via INTRAVENOUS

## 2018-11-05 MED ORDER — HYDROMORPHONE HCL 1 MG/ML IJ SOLN
1.0000 mg | Freq: Once | INTRAMUSCULAR | Status: AC
  Administered 2018-11-05: 1 mg via INTRAVENOUS
  Filled 2018-11-05: qty 1

## 2018-11-05 MED ORDER — ONDANSETRON HCL 4 MG/2ML IJ SOLN
4.0000 mg | Freq: Once | INTRAMUSCULAR | Status: AC
  Administered 2018-11-05: 4 mg via INTRAVENOUS
  Filled 2018-11-05: qty 2

## 2018-11-05 MED ORDER — ONDANSETRON 8 MG PO TBDP: 8.0000 mg | ORAL_TABLET | Freq: Three times a day (TID) | ORAL | 0 refills | Status: DC | PRN

## 2018-11-05 MED ORDER — OXYCODONE-ACETAMINOPHEN 5-325 MG PO TABS: 2.0000 | ORAL_TABLET | ORAL | 0 refills | Status: DC | PRN

## 2018-11-05 NOTE — MAU Note (Signed)
Nichole Kirk is a 38 y.o. here in MAU reporting: psot TAB on jan 10 with increased pain and vaginal bleeding. Pt arrived via EMS stating she has saturated around 13 pads today. Was given 100 mcg of fentanyl on the truck. States she had an apt on Monday and they did an u/s and said there was nothing in her uterus.  Onset of complaint: today Pain score: 10/10

## 2018-11-05 NOTE — MAU Provider Note (Signed)
History     CSN: [id number]  Arrival date and time: 11/05/18 1616   First Provider Initiated Contact with Patient 11/05/18 1627      Chief Complaint  Patient presents with  . Abdominal Pain  . Vaginal Bleeding   HPI Nichole Kirk is a 38 y.o. [phone] who presents with vaginal bleeding and abdominal pain. She had a TAB on 1/10 with cytotec. She states she had stopped bleeding and had follow up on Monday with an ultrasound that said there was nothing in the uterus. She states she started bleeding heavily this morning and has been through 13 pads total today. She reports abdominal cramping that she rates 10/10 and has not tried anything for. She denies any dizziness or lightheadedness.  OB History    Gravida  6   Para  2   Term  2   Preterm  0   AB  4   Living  3     SAB  2   TAB  2   Ectopic  0   Multiple  1   Live Births  2           Past Medical History:  Diagnosis Date  . Abnormal Pap smear   . Anxiety 2012   no meds   . Asthma    albuterol inhaler  . Gonorrhea 2007  . Ovarian cyst   . Urinary tract infection     Past Surgical History:  Procedure Laterality Date  . CESAREAN SECTION  2010  . CESAREAN SECTION  07/29/2011   Procedure: CESAREAN SECTION;  Surgeon: Almon Hercules;  Location: [facility];  Service: Gynecology;  Laterality: N/A;  Repeat  . CHOLECYSTECTOMY    . DILATION AND CURETTAGE OF UTERUS    . DILATION AND EVACUATION N/A 11/22/2017   Procedure: DILATATION AND EVACUATION;  Surgeon: Levi Aland, MD;  Location: [facility];  Service: Gynecology;  Laterality: N/A;  . mab     x 1 - no surgery required 2009  . WISDOM TOOTH EXTRACTION      Family History  Problem Relation Age of Onset  . Cancer Mother        Breast  . Diabetes Mother   . Diabetes Maternal Grandfather   . Cancer Maternal Grandmother        Breast  . Lupus Maternal Grandmother   . Cancer Father   . Multiple sclerosis Sister   . Cancer Paternal Aunt    breast  . Cancer Paternal Aunt        breast  . Cancer Paternal Aunt        ovarian    Social History   Tobacco Use  . Smoking status: Never Smoker  . Smokeless tobacco: Never Used  Substance Use Topics  . Alcohol use: No    Alcohol/week: 1.0 - 3.0 standard drinks    Types: 1 - 3 Cans of beer per week    Frequency: Never    Comment: socially but none with pregnancy  . Drug use: No    Allergies:  Allergies  Allergen Reactions  . Aspirin Anaphylaxis    Pt says she can take ibuprofen  . Mushroom Extract Complex Swelling and Rash    Medications Prior to Admission  Medication Sig Dispense Refill Last Dose  . albuterol (PROVENTIL HFA;VENTOLIN HFA) 108 (90 Base) MCG/ACT inhaler Inhale 1-2 puffs into the lungs every 6 (six) hours as needed for wheezing or shortness of breath.   11/22/2017 at 0730  .  budesonide-formoterol (SYMBICORT) 160-4.5 MCG/ACT inhaler Inhale 2 puffs into the lungs 2 (two) times daily. 1 Inhaler 11 11/22/2017 at 0730  . cyclobenzaprine (FLEXERIL) 10 MG tablet Take 1 tablet (10 mg total) by mouth 3 (three) times daily as needed for muscle spasms. (Patient not taking: Reported on 11/18/2017) 10 tablet 0 Not Taking at Unknown time    Review of Systems  Constitutional: Negative.  Negative for fatigue and fever.  HENT: Negative.   Respiratory: Negative.  Negative for shortness of breath.   Cardiovascular: Negative.  Negative for chest pain.  Gastrointestinal: Positive for abdominal pain. Negative for constipation, diarrhea, nausea and vomiting.  Genitourinary: Positive for vaginal bleeding. Negative for dysuria.  Neurological: Negative.  Negative for dizziness and headaches.   Physical Exam   Blood pressure 121/68, pulse 88, temperature 98.5 F (36.9 C), temperature source Oral, resp. rate 20, unknown if currently breastfeeding.  Physical Exam  Nursing note and vitals reviewed. Constitutional: She is oriented to person, place, and time. She appears  well-developed and well-nourished. No distress.  HENT:  Head: Normocephalic.  Eyes: Pupils are equal, round, and reactive to light.  Cardiovascular: Normal rate, regular rhythm and normal heart sounds.  Respiratory: Effort normal and breath sounds normal. No respiratory distress.  GI: Soft. Bowel sounds are normal. She exhibits no distension. There is abdominal tenderness. There is guarding.  Genitourinary:    Genitourinary Comments: Small amount of bright red bleeding and clots   Neurological: She is alert and oriented to person, place, and time.  Skin: Skin is warm and dry.  Psychiatric: She has a normal mood and affect. Her behavior is normal. Judgment and thought content normal.    MAU Course  Procedures Results for orders placed or performed during the hospital encounter of 11/05/18 (from the past 24 hour(s))  CBC with Differential/Platelet     Status: Abnormal   Collection Time: 11/05/18  4:39 PM  Result Value Ref Range   WBC 10.7 (H) 4.0 - 10.5 K/uL   RBC 4.01 3.87 - 5.11 MIL/uL   Hemoglobin 11.7 (L) 12.0 - 15.0 g/dL   HCT 29.535.6 (L) 28.436.0 - 13.246.0 %   MCV 88.8 80.0 - 100.0 fL   MCH 29.2 26.0 - 34.0 pg   MCHC 32.9 30.0 - 36.0 g/dL   RDW 44.012.3 10.211.5 - 72.515.5 %   Platelets 209 150 - 400 K/uL   nRBC 0.0 0.0 - 0.2 %   Neutrophils Relative % 69 %   Neutro Abs 7.4 1.7 - 7.7 K/uL   Lymphocytes Relative 26 %   Lymphs Abs 2.8 0.7 - 4.0 K/uL   Monocytes Relative 4 %   Monocytes Absolute 0.4 0.1 - 1.0 K/uL   Eosinophils Relative 1 %   Eosinophils Absolute 0.1 0.0 - 0.5 K/uL   Basophils Relative 0 %   Basophils Absolute 0.0 0.0 - 0.1 K/uL  hCG, quantitative, pregnancy     Status: Abnormal   Collection Time: 11/05/18  4:39 PM  Result Value Ref Range   hCG, Beta Chain, Quant, S 188 (H) <5 mIU/mL   [place] Pelvic Complete With Transvaginal  Result Date: 11/05/2018 CLINICAL DATA:  Abdominal pain and vaginal bleeding. Status post therapeutic abortion on 10/14/2018 EXAM: TRANSABDOMINAL AND  TRANSVAGINAL ULTRASOUND OF PELVIS TECHNIQUE: Both transabdominal and transvaginal ultrasound examinations of the pelvis were performed. Transabdominal technique was performed for global imaging of the pelvis including uterus, ovaries, adnexal regions, and pelvic cul-de-sac. It was necessary to proceed with endovaginal exam following the transabdominal  exam to visualize the endometrium. COMPARISON:  None FINDINGS: Uterus Measurements: 10.7 x 4.5 x 5.2 cm = volume: 125 mL. No fibroids or other mass visualized. Endometrium Thickness: 2.0 cm. Endometrial cavity is heterogeneous and diffusely thickened down into the cervical canal. There is some minimal flow signal on color Doppler imaging associated with the endometrial canal of the fundal region, but most of the tissue in the canal shows no color signal on Doppler evaluation. There is no color signal in the contents in the cervical canal. Right ovary Measurements: 3.0 x 2.2 x 1.7 cm = volume: 6 mL. Normal appearance/no adnexal mass. Left ovary Measurements: 2.9 x 2.0 x 1.7 cm = volume: 5.4 mL. Normal appearance/no adnexal mass. Other findings No abnormal free fluid. IMPRESSION: Endometrial canal is greater than 10 mm thickness at 2.0 cm. Complex material in the endometrial canal and cervical canal identified with some potential flow signal associated with the endometrial contents in the mid cervical region. No flow signal is discernible within the contents of the cervical canal. These findings are considered nonspecific and could indicate endometrial blood products although hypovascular retained products of conception can not be excluded. Consider short-term follow-up pelvic ultrasound further evaluation with pelvic MRI with and without IV contrast, as clinically warranted. Electronically Signed   By: Kennith Center M.D.   On: 11/05/2018 18:56   MDM CBC HCG Dilaudid 1mg  LR bolus US Pelvis Comp Zofran IV  [place] concerning for retained POC, patient with stable VS  and Hgb. Discussed with patient options for management including cytotec or D&C. Risks and benefits of both reviewed at length. Patient desires outpatient D&C. Will send message to schedule patient next week.  Assessment and Plan   1. Retained products of conception after induced termination of pregnancy   2. Abdominal pain affecting pregnancy    -Discharge home in stable condition -Rx for zofran and percocet given to patient. Encouraged patient to use ibuprofen  -Abdominal pain and vaginal bleeding precautions discussed -Patient advised to follow-up with [facility], [place] next week for [facility] [address], will call with appointment -Patient may return to MAU as needed or if her condition were to change or worsen   Rolm Bookbinder CNM 11/05/2018, 4:27 PM

## 2018-11-07 ENCOUNTER — Encounter (HOSPITAL_COMMUNITY): Payer: Self-pay

## 2018-11-07 ENCOUNTER — Encounter (HOSPITAL_BASED_OUTPATIENT_CLINIC_OR_DEPARTMENT_OTHER): Payer: Self-pay | Admitting: *Deleted

## 2018-11-07 ENCOUNTER — Other Ambulatory Visit: Payer: Self-pay

## 2018-11-07 DIAGNOSIS — O034 Incomplete spontaneous abortion without complication: Secondary | ICD-10-CM | POA: Diagnosis present

## 2018-11-07 MED ORDER — DOXYCYCLINE HYCLATE 100 MG IV SOLR
200.0000 mg | INTRAVENOUS | Status: AC
  Filled 2018-11-07 (×4): qty 200

## 2018-11-07 NOTE — Progress Notes (Addendum)
Pre op phone call done. Also called Dr. Macon Large to ask if pt will need PAT appt. She said that she will put her orders in and thay pt may need a CBC on the day of surgery but  none needed prior.

## 2018-11-08 ENCOUNTER — Encounter (HOSPITAL_BASED_OUTPATIENT_CLINIC_OR_DEPARTMENT_OTHER)
Admission: RE | Disposition: A | Discharge status: Home / Self Care | Payer: Self-pay | Source: Home / Self Care | Attending: Obstetrics & Gynecology

## 2018-11-08 ENCOUNTER — Ambulatory Visit (HOSPITAL_BASED_OUTPATIENT_CLINIC_OR_DEPARTMENT_OTHER): Payer: Medicaid Other | Admitting: Anesthesiology

## 2018-11-08 ENCOUNTER — Other Ambulatory Visit: Payer: Self-pay

## 2018-11-08 ENCOUNTER — Ambulatory Visit (HOSPITAL_BASED_OUTPATIENT_CLINIC_OR_DEPARTMENT_OTHER)
Admission: RE | Admit: 2018-11-08 | Discharge: 2018-11-08 | Disposition: A | Discharge status: Home / Self Care | Payer: Medicaid Other | Attending: Obstetrics & Gynecology | Admitting: Obstetrics & Gynecology

## 2018-11-08 ENCOUNTER — Encounter (HOSPITAL_BASED_OUTPATIENT_CLINIC_OR_DEPARTMENT_OTHER): Payer: Self-pay

## 2018-11-08 DIAGNOSIS — Z820 Family history of epilepsy and other diseases of the nervous system: Secondary | ICD-10-CM | POA: Insufficient documentation

## 2018-11-08 DIAGNOSIS — Z808 Family history of malignant neoplasm of other organs or systems: Secondary | ICD-10-CM | POA: Insufficient documentation

## 2018-11-08 DIAGNOSIS — Z91018 Allergy to other foods: Secondary | ICD-10-CM | POA: Insufficient documentation

## 2018-11-08 DIAGNOSIS — O99512 Diseases of the respiratory system complicating pregnancy, second trimester: Secondary | ICD-10-CM | POA: Diagnosis not present

## 2018-11-08 DIAGNOSIS — O074 Failed attempted termination of pregnancy without complication: Secondary | ICD-10-CM | POA: Diagnosis not present

## 2018-11-08 DIAGNOSIS — Z8041 Family history of malignant neoplasm of ovary: Secondary | ICD-10-CM | POA: Insufficient documentation

## 2018-11-08 DIAGNOSIS — Z833 Family history of diabetes mellitus: Secondary | ICD-10-CM | POA: Insufficient documentation

## 2018-11-08 DIAGNOSIS — Z803 Family history of malignant neoplasm of breast: Secondary | ICD-10-CM | POA: Diagnosis not present

## 2018-11-08 DIAGNOSIS — Z9049 Acquired absence of other specified parts of digestive tract: Secondary | ICD-10-CM | POA: Insufficient documentation

## 2018-11-08 DIAGNOSIS — J45909 Unspecified asthma, uncomplicated: Secondary | ICD-10-CM | POA: Diagnosis not present

## 2018-11-08 DIAGNOSIS — Z3A16 16 weeks gestation of pregnancy: Secondary | ICD-10-CM | POA: Diagnosis not present

## 2018-11-08 DIAGNOSIS — O99342 Other mental disorders complicating pregnancy, second trimester: Secondary | ICD-10-CM | POA: Diagnosis not present

## 2018-11-08 DIAGNOSIS — Z886 Allergy status to analgesic agent status: Secondary | ICD-10-CM | POA: Insufficient documentation

## 2018-11-08 DIAGNOSIS — O034 Incomplete spontaneous abortion without complication: Secondary | ICD-10-CM

## 2018-11-08 DIAGNOSIS — O039 Complete or unspecified spontaneous abortion without complication: Secondary | ICD-10-CM | POA: Diagnosis present

## 2018-11-08 HISTORY — PX: DILATION AND EVACUATION: SHX1459

## 2018-11-08 SURGERY — DILATION AND EVACUATION, UTERUS
Anesthesia: General | Site: Vagina

## 2018-11-08 MED ORDER — LACTATED RINGERS IV SOLN
INTRAVENOUS | Status: DC
  Administered 2018-11-08: 14:00:00 via INTRAVENOUS

## 2018-11-08 MED ORDER — OXYCODONE HCL 5 MG/5ML PO SOLN: 5.0000 mg | Freq: Once | ORAL | Status: DC | PRN

## 2018-11-08 MED ORDER — LACTATED RINGERS IV SOLN: INTRAVENOUS | Status: DC

## 2018-11-08 MED ORDER — FENTANYL CITRATE (PF) 100 MCG/2ML IJ SOLN
INTRAMUSCULAR | Status: AC
  Filled 2018-11-08: qty 2

## 2018-11-08 MED ORDER — FENTANYL CITRATE (PF) 100 MCG/2ML IJ SOLN: 25.0000 ug | INTRAMUSCULAR | Status: DC | PRN

## 2018-11-08 MED ORDER — DOXYCYCLINE HYCLATE 100 MG IV SOLR
200.0000 mg | INTRAVENOUS | Status: AC
  Administered 2018-11-08: 200 mg via INTRAVENOUS

## 2018-11-08 MED ORDER — PROPOFOL 10 MG/ML IV BOLUS
INTRAVENOUS | Status: AC
  Filled 2018-11-08: qty 20

## 2018-11-08 MED ORDER — LIDOCAINE HCL (CARDIAC) PF 100 MG/5ML IV SOSY
PREFILLED_SYRINGE | INTRAVENOUS | Status: DC | PRN
  Administered 2018-11-08: 40 mg via INTRAVENOUS

## 2018-11-08 MED ORDER — FENTANYL CITRATE (PF) 100 MCG/2ML IJ SOLN
50.0000 ug | INTRAMUSCULAR | Status: DC | PRN
  Administered 2018-11-08: 100 ug via INTRAVENOUS

## 2018-11-08 MED ORDER — BUPIVACAINE HCL (PF) 0.5 % IJ SOLN
INTRAMUSCULAR | Status: AC
  Filled 2018-11-08: qty 30

## 2018-11-08 MED ORDER — OXYCODONE-ACETAMINOPHEN 5-325 MG PO TABS: 1.0000 | ORAL_TABLET | ORAL | 0 refills | Status: DC | PRN

## 2018-11-08 MED ORDER — ONDANSETRON HCL 4 MG/2ML IJ SOLN
INTRAMUSCULAR | Status: AC
  Filled 2018-11-08: qty 2

## 2018-11-08 MED ORDER — DEXAMETHASONE SODIUM PHOSPHATE 10 MG/ML IJ SOLN
INTRAMUSCULAR | Status: AC
  Filled 2018-11-08: qty 1

## 2018-11-08 MED ORDER — SCOPOLAMINE 1 MG/3DAYS TD PT72: 1.0000 | MEDICATED_PATCH | Freq: Once | TRANSDERMAL | Status: DC | PRN

## 2018-11-08 MED ORDER — BUPIVACAINE HCL 0.5 % IJ SOLN
INTRAMUSCULAR | Status: DC | PRN
  Administered 2018-11-08: 30 mL

## 2018-11-08 MED ORDER — ONDANSETRON HCL 4 MG/2ML IJ SOLN
INTRAMUSCULAR | Status: DC | PRN
  Administered 2018-11-08: 4 mg via INTRAVENOUS

## 2018-11-08 MED ORDER — ACETAMINOPHEN 10 MG/ML IV SOLN: 1000.0000 mg | Freq: Once | INTRAVENOUS | Status: DC | PRN

## 2018-11-08 MED ORDER — DEXAMETHASONE SODIUM PHOSPHATE 4 MG/ML IJ SOLN
INTRAMUSCULAR | Status: DC | PRN
  Administered 2018-11-08: 10 mg via INTRAVENOUS

## 2018-11-08 MED ORDER — DOCUSATE SODIUM 100 MG PO CAPS: 100.0000 mg | ORAL_CAPSULE | Freq: Two times a day (BID) | ORAL | 2 refills | Status: DC | PRN

## 2018-11-08 MED ORDER — OXYCODONE HCL 5 MG PO TABS: 5.0000 mg | ORAL_TABLET | Freq: Once | ORAL | Status: DC | PRN

## 2018-11-08 MED ORDER — MIDAZOLAM HCL 2 MG/2ML IJ SOLN
INTRAMUSCULAR | Status: AC
  Filled 2018-11-08: qty 2

## 2018-11-08 MED ORDER — MIDAZOLAM HCL 2 MG/2ML IJ SOLN
1.0000 mg | INTRAMUSCULAR | Status: DC | PRN
  Administered 2018-11-08: 2 mg via INTRAVENOUS

## 2018-11-08 MED ORDER — PROPOFOL 10 MG/ML IV BOLUS
INTRAVENOUS | Status: DC | PRN
  Administered 2018-11-08: 200 mg via INTRAVENOUS

## 2018-11-08 MED ORDER — IBUPROFEN 800 MG PO TABS: 800.0000 mg | ORAL_TABLET | Freq: Three times a day (TID) | ORAL | 3 refills | Status: DC | PRN

## 2018-11-08 MED ORDER — PROMETHAZINE HCL 25 MG/ML IJ SOLN: 6.2500 mg | INTRAMUSCULAR | Status: DC | PRN

## 2018-11-08 SURGICAL SUPPLY — 22 items
BRIEF STRETCH FOR OB PAD XXL (UNDERPADS AND DIAPERS) ×2 IMPLANT
CATH ROBINSON RED A/P 14FR (CATHETERS) ×2 IMPLANT
DECANTER SPIKE VIAL GLASS SM (MISCELLANEOUS) ×2 IMPLANT
GLOVE BIOGEL PI IND STRL 7.0 (GLOVE) ×1 IMPLANT
GLOVE BIOGEL PI INDICATOR 7.0 (GLOVE) ×1
GLOVE ECLIPSE 7.0 STRL STRAW (GLOVE) ×2 IMPLANT
GOWN STRL REUS W/ TWL LRG LVL3 (GOWN DISPOSABLE) ×1 IMPLANT
GOWN STRL REUS W/TWL LRG LVL3 (GOWN DISPOSABLE) ×4 IMPLANT
NS IRRIG 1000ML POUR BTL (IV SOLUTION) ×2 IMPLANT
PACK VAGINAL MINOR WOMEN LF (CUSTOM PROCEDURE TRAY) ×2 IMPLANT
PAD OB MATERNITY 4.3X12.25 (PERSONAL CARE ITEMS) ×2 IMPLANT
PAD PREP 24X48 CUFFED NSTRL (MISCELLANEOUS) ×2 IMPLANT
SET BERKELEY SUCTION TUBING (SUCTIONS) ×2 IMPLANT
TOWEL GREEN STERILE FF (TOWEL DISPOSABLE) ×2 IMPLANT
TRAP TISSUE FILTER (MISCELLANEOUS) ×2 IMPLANT
VACURETTE 10 RIGID CVD (CANNULA) IMPLANT
VACURETTE 6 ASPIR F TIP BERK (CANNULA) IMPLANT
VACURETTE 7MM CVD STRL WRAP (CANNULA) IMPLANT
VACURETTE 7MM STRAIGHT (CANNULA) IMPLANT
VACURETTE 8 RIGID CVD (CANNULA) IMPLANT
VACURETTE 9 RIGID CVD (CANNULA) IMPLANT
VACURRETTE 7MM STRAIGHT (CANNULA)

## 2018-11-08 NOTE — Anesthesia Postprocedure Evaluation (Signed)
Anesthesia Post Note  Patient: Nichole Kirk  Procedure(s) Performed: DILATATION AND EVACUATION (N/A Vagina )     Patient location during evaluation: PACU Anesthesia Type: General Level of consciousness: awake and alert Pain management: pain level controlled Vital Signs Assessment: post-procedure vital signs reviewed and stable Respiratory status: spontaneous breathing, nonlabored ventilation and respiratory function stable Cardiovascular status: blood pressure returned to baseline and stable Postop Assessment: no apparent nausea or vomiting Anesthetic complications: no    Last Vitals:  Vitals:   11/08/18 1530 11/08/18 1545  BP: 106/75 114/85  Pulse: 61 65  Resp: 14 13  Temp:    SpO2: 100% 100%    Last Pain:  Vitals:   11/08/18 1530  TempSrc:   PainSc: 0-No pain                 Kaylyn Layer

## 2018-11-08 NOTE — Anesthesia Preprocedure Evaluation (Addendum)
Anesthesia Evaluation  Patient identified by MRN, date of birth, ID band Patient awake    Reviewed: Allergy & Precautions, NPO status , Patient's Chart, lab work & pertinent test results  History of Anesthesia Complications Negative for: history of anesthetic complications  Airway Mallampati: II  TM Distance: >3 FB Neck ROM: Full    Dental  (+) Teeth Intact, Dental Advisory Given   Pulmonary asthma ,    Pulmonary exam normal breath sounds clear to auscultation       Cardiovascular negative cardio ROS Normal cardiovascular exam Rhythm:Regular Rate:Normal     Neuro/Psych negative neurological ROS     GI/Hepatic negative GI ROS, Neg liver ROS,   Endo/Other  negative endocrine ROS  Renal/GU negative Renal ROS     Musculoskeletal negative musculoskeletal ROS (+)   Abdominal   Peds  Hematology negative hematology ROS (+)   Anesthesia Other Findings Day of surgery medications reviewed with the patient.  Reproductive/Obstetrics Retained POC                            Anesthesia Physical Anesthesia Plan  ASA: II  Anesthesia Plan: General   Post-op Pain Management:    Induction: Intravenous  PONV Risk Score and Plan: 3 and Treatment may vary due to age or medical condition, Ondansetron, Dexamethasone, Midazolam and Scopolamine patch - Pre-op  Airway Management Planned: LMA  Additional Equipment:   Intra-op Plan:   Post-operative Plan: Extubation in OR  Informed Consent: I have reviewed the patients History and Physical, chart, labs and discussed the procedure including the risks, benefits and alternatives for the proposed anesthesia with the patient or authorized representative who has indicated his/her understanding and acceptance.     Dental advisory given  Plan Discussed with: CRNA  Anesthesia Plan Comments:        Anesthesia Quick Evaluation

## 2018-11-08 NOTE — Anesthesia Procedure Notes (Signed)
Procedure Name: LMA Insertion Date/Time: 11/08/2018 2:23 PM Performed by: Burna Cash, CRNA Pre-anesthesia Checklist: Patient identified, Emergency Drugs available, Suction available and Patient being monitored Patient Re-evaluated:Patient Re-evaluated prior to induction Oxygen Delivery Method: Circle system utilized Preoxygenation: Pre-oxygenation with 100% oxygen Induction Type: IV induction Ventilation: Mask ventilation without difficulty LMA: LMA inserted LMA Size: 4.0 Number of attempts: 1 Airway Equipment and Method: Bite block Placement Confirmation: positive ETCO2 Tube secured with: Tape Dental Injury: Teeth and Oropharynx as per pre-operative assessment

## 2018-11-08 NOTE — Discharge Instructions (Signed)
Dilation and Curettage or Vacuum Curettage, Care After °These instructions give you information about caring for yourself after your procedure. Your doctor may also give you more specific instructions. Call your doctor if you have any problems or questions after your procedure. °Follow these instructions at home: °Activity °· Do not drive or use heavy machinery while taking prescription pain medicine. °· For 24 hours after your procedure, avoid driving. °· Take short walks often, followed by rest periods. Ask your doctor what activities are safe for you. After one or two days, you may be able to return to your normal activities. °· Do not lift anything that is heavier than 10 lb (4.5 kg) until your doctor approves. °· For at least 2 weeks, or as long as told by your doctor: °? Do not douche. °? Do not use tampons. °? Do not have sex. °General instructions ° °· Take over-the-counter and prescription medicines only as told by your doctor. This is very important if you take blood thinning medicine. °· Do not take baths, swim, or use a hot tub until your doctor approves. Take showers instead of baths. °· Wear compression stockings as told by your doctor. °· It is up to you to get the results of your procedure. Ask your doctor when your results will be ready. °· Keep all follow-up visits as told by your doctor. This is important. °Contact a doctor if: °· You have very bad cramps that get worse or do not get better with medicine. °· You have very bad pain in your belly (abdomen). °· You cannot drink fluids without throwing up (vomiting). °· You get pain in a different part of the area between your belly and thighs (pelvis). °· You have bad-smelling discharge from your vagina. °· You have a rash. °Get help right away if: °· You are bleeding a lot from your vagina. A lot of bleeding means soaking more than one sanitary pad in an hour, for 2 hours in a row. °· You have clumps of blood (blood clots) coming from your  vagina. °· You have a fever or chills. °· Your belly feels very tender or hard. °· You have chest pain. °· You have trouble breathing. °· You cough up blood. °· You feel dizzy. °· You feel light-headed. °· You pass out (faint). °· You have pain in your neck or shoulder area. °Summary °· Take short walks often, followed by rest periods. Ask your doctor what activities are safe for you. After one or two days, you may be able to return to your normal activities. °· Do not lift anything that is heavier than 10 lb (4.5 kg) until your doctor approves. °· Do not take baths, swim, or use a hot tub until your doctor approves. Take showers instead of baths. °· Contact your doctor if you have any symptoms of infection, like bad-smelling discharge from your vagina. °This information is not intended to replace advice given to you by your health care provider. Make sure you discuss any questions you have with your health care provider. °Document Released: 06/30/2008 Document Revised: 06/08/2016 Document Reviewed: 06/08/2016 °Elsevier Interactive Patient Education © 2019 [employer] ° ° ° °Post Anesthesia Home Care Instructions ° °Activity: °Get plenty of rest for the remainder of the day. A responsible individual must stay with you for 24 hours following the procedure.  °For the next 24 hours, DO NOT: °-Drive a car °-Operate machinery °-Drink alcoholic beverages °-Take any medication unless instructed by your physician °-Make any legal   decisions or sign important papers. ° °Meals: °Start with liquid foods such as gelatin or soup. Progress to regular foods as tolerated. Avoid greasy, spicy, heavy foods. If nausea and/or vomiting occur, drink only clear liquids until the nausea and/or vomiting subsides. Call your physician if vomiting continues. ° °Special Instructions/Symptoms: °Your throat may feel dry or sore from the anesthesia or the breathing tube placed in your throat during surgery. If this causes discomfort, gargle with  warm salt water. The discomfort should disappear within 24 hours. ° °If you had a scopolamine patch placed behind your ear for the management of post- operative nausea and/or vomiting: ° °1. The medication in the patch is effective for 72 hours, after which it should be removed.  Wrap patch in a tissue and discard in the trash. Wash hands thoroughly with soap and water. °2. You may remove the patch earlier than 72 hours if you experience unpleasant side effects which may include dry mouth, dizziness or visual disturbances. °3. Avoid touching the patch. Wash your hands with soap and water after contact with the patch. °   ° °

## 2018-11-08 NOTE — H&P (Signed)
Preoperative History and Physical  Nichole Kirk is a 38 y.o. [phone] here for surgical management of retained products of conception following misoprostol abortion.   No significant preoperative concerns.  Proposed surgery: Dilation and Evacuation  Past Medical History:  Diagnosis Date  . Abnormal Pap smear   . Anxiety 2012   no meds   . Asthma    albuterol inhaler  . Gonorrhea 2007  . Ovarian cyst   . Urinary tract infection    Past Surgical History:  Procedure Laterality Date  . CESAREAN SECTION  2010  . CESAREAN SECTION  07/29/2011   Procedure: CESAREAN SECTION;  Surgeon: Almon HerculesKendra H. Ross;  Location: [facility];  Service: Gynecology;  Laterality: N/A;  Repeat  . CHOLECYSTECTOMY    . DILATION AND CURETTAGE OF UTERUS    . DILATION AND EVACUATION N/A 11/22/2017   Procedure: DILATATION AND EVACUATION;  Surgeon: Levi AlandAnderson, Mark E, MD;  Location: [facility];  Service: Gynecology;  Laterality: N/A;  . mab     x 1 - no surgery required 2009  . WISDOM TOOTH EXTRACTION     OB History  Gravida Para Term Preterm AB Living  6 2 2  0 4 3  SAB TAB Ectopic Multiple Live Births  2 2 0 1 2    # Outcome Date GA Lbr Len/2nd Weight Sex Delivery Anes PTL Lv  6 TAB 2020          5 SAB 2019          4 Term 07/29/11 [id number]  3737 g M CS-LTranv Spinal  LIV  3 Term 09/05/09    M CS-LTranv Spinal N LIV     Birth Comments: twins  2 SAB           1 TAB             No current facility-administered medications on file prior to encounter.    Current Outpatient Medications on File Prior to Encounter  Medication Sig Dispense Refill  . budesonide-formoterol (SYMBICORT) 160-4.5 MCG/ACT inhaler Inhale 2 puffs into the lungs 2 (two) times daily. 1 Inhaler 11  . Norgestimate-Ethinyl Estradiol Triphasic (TRI-SPRINTEC) 0.18/0.215/0.25 MG-35 MCG tablet Take 1 tablet by mouth daily.    . ondansetron (ZOFRAN ODT) 8 MG disintegrating tablet Take 1 tablet (8 mg total) by mouth every 8 (eight) hours as needed for  nausea or vomiting. 20 tablet 0  . oxyCODONE-acetaminophen (PERCOCET/ROXICET) 5-325 MG tablet Take 2 tablets by mouth every 4 (four) hours as needed for severe pain. 15 tablet 0  . albuterol (PROVENTIL HFA;VENTOLIN HFA) 108 (90 Base) MCG/ACT inhaler Inhale 1-2 puffs into the lungs every 6 (six) hours as needed for wheezing or shortness of breath.     Allergies  Allergen Reactions  . Aspirin Anaphylaxis    Pt says she can take ibuprofen  . Mushroom Extract Complex Swelling and Rash    Social History:   reports that she has never smoked. She has never used smokeless tobacco. She reports that she does not drink alcohol or use drugs.  Family History  Problem Relation Age of Onset  . Cancer Mother        Breast  . Diabetes Mother   . Diabetes Maternal Grandfather   . Cancer Maternal Grandmother        Breast  . Lupus Maternal Grandmother   . Cancer Father   . Multiple sclerosis Sister   . Cancer Paternal Aunt        breast  .  Cancer Paternal Aunt        breast  . Cancer Paternal Aunt        ovarian    Review of Systems: Pertinent items noted in HPI and remainder of comprehensive ROS otherwise negative.  PHYSICAL EXAM: Blood pressure 113/72, pulse 79, temperature 97.8 F (36.6 C), temperature source Oral, resp. rate 16, height 5\' 11"  (1.803 m), weight 102.5 kg, last menstrual period 08/23/2018, SpO2 100 %, unknown if currently breastfeeding. CONSTITUTIONAL: Well-developed, well-nourished female in no acute distress.  HENT:  Normocephalic, atraumatic, External right and left ear normal. Oropharynx is clear and moist EYES: Conjunctivae and EOM are normal. Pupils are equal, round, and reactive to light. No scleral icterus.  NECK: Normal range of motion, supple, no masses SKIN: Skin is warm and dry. No rash noted. Not diaphoretic. No erythema. No pallor. NEUROLOGIC: Alert and oriented to person, place, and time. Normal reflexes, muscle tone coordination. No cranial nerve deficit  noted. PSYCHIATRIC: Normal mood and affect. Normal behavior. Normal judgment and thought content. CARDIOVASCULAR: Normal heart rate noted, regular rhythm RESPIRATORY: Effort and breath sounds normal, no problems with respiration noted ABDOMEN: Soft, nontender, nondistended. PELVIC: Deferred MUSCULOSKELETAL: Normal range of motion. No edema and no tenderness. 2+ distal pulses.  Labs: Results for orders placed or performed during the hospital encounter of 11/05/18 (from the past 336 hour(s))  CBC with Differential/Platelet   Collection Time: 11/05/18  4:39 PM  Result Value Ref Range   WBC 10.7 (H) 4.0 - 10.5 K/uL   RBC 4.01 3.87 - 5.11 MIL/uL   Hemoglobin 11.7 (L) 12.0 - 15.0 g/dL   HCT 41.3 (L) 24.4 - 01.0 %   MCV 88.8 80.0 - 100.0 fL   MCH 29.2 26.0 - 34.0 pg   MCHC 32.9 30.0 - 36.0 g/dL   RDW 27.2 53.6 - 64.4 %   Platelets 209 150 - 400 K/uL   nRBC 0.0 0.0 - 0.2 %   Neutrophils Relative % 69 %   Neutro Abs 7.4 1.7 - 7.7 K/uL   Lymphocytes Relative 26 %   Lymphs Abs 2.8 0.7 - 4.0 K/uL   Monocytes Relative 4 %   Monocytes Absolute 0.4 0.1 - 1.0 K/uL   Eosinophils Relative 1 %   Eosinophils Absolute 0.1 0.0 - 0.5 K/uL   Basophils Relative 0 %   Basophils Absolute 0.0 0.0 - 0.1 K/uL  hCG, quantitative, pregnancy   Collection Time: 11/05/18  4:39 PM  Result Value Ref Range   hCG, Beta Chain, Quant, S 188 (H) <5 mIU/mL    Imaging Studies: US Breast Ltd Uni Right Inc Axilla  Result Date: 10/21/2018 CLINICAL DATA:  First six-month follow-up for probably benign RIGHT breast mass. The patient was recently pregnant and had D and C for spontaneous abortion at 16 weeks. The patient has a family history of breast cancer, diagnosed in her mother in her early 7s. EXAM: ULTRASOUND OF THE RIGHT BREAST COMPARISON:  04/19/2018 FINDINGS: Targeted ultrasound is performed, showing a circumscribed oval hypoechoic parallel mass in the 4 o'clock location of the RIGHT breast 6 centimeters from the  nipple which measures 1.2 x 0.7 x 1.0 centimeters. Previously mass measured 1.0 x 0.6 x 0 8 centimeters. 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 Continued followup is recommended to document 2 years of stability. RECOMMENDATION: Bilateral diagnostic mammogram and RIGHT breast ultrasound are recommended in July 2020. I have discussed the findings and recommendations with the patient.  Results were also provided in writing at the conclusion of the visit. If applicable, a reminder letter will be sent to the patient regarding the next appointment. BI-RADS CATEGORY  3: Probably benign. Electronically Signed   By: Norva PavlovElizabeth  Brown M.D.   On: 10/21/2018 11:06   [place] Pelvic Complete With Transvaginal  Result Date: 11/05/2018 CLINICAL DATA:  Abdominal pain and vaginal bleeding. Status post therapeutic abortion on 10/14/2018 EXAM: TRANSABDOMINAL AND TRANSVAGINAL ULTRASOUND OF PELVIS TECHNIQUE: Both transabdominal and transvaginal ultrasound examinations of the pelvis were performed. Transabdominal technique was performed for global imaging of the pelvis including uterus, ovaries, adnexal regions, and pelvic cul-de-sac. It was necessary to proceed with endovaginal exam following the transabdominal exam to visualize the endometrium. COMPARISON:  None FINDINGS: Uterus Measurements: 10.7 x 4.5 x 5.2 cm = volume: 125 mL. No fibroids or other mass visualized. Endometrium Thickness: 2.0 cm. Endometrial cavity is heterogeneous and diffusely thickened down into the cervical canal. There is some minimal flow signal on color Doppler imaging associated with the endometrial canal of the fundal region, but most of the tissue in the canal shows no color signal on Doppler evaluation. There is no color signal in the contents in the cervical canal. Right ovary Measurements: 3.0 x 2.2 x 1.7 cm = volume: 6 mL. Normal appearance/no adnexal mass. Left ovary Measurements: 2.9 x  2.0 x 1.7 cm = volume: 5.4 mL. Normal appearance/no adnexal mass. Other findings No abnormal free fluid. IMPRESSION: Endometrial canal is greater than 10 mm thickness at 2.0 cm. Complex material in the endometrial canal and cervical canal identified with some potential flow signal associated with the endometrial contents in the mid cervical region. No flow signal is discernible within the contents of the cervical canal. These findings are considered nonspecific and could indicate endometrial blood products although hypovascular retained products of conception can not be excluded. Consider short-term follow-up pelvic ultrasound further evaluation with pelvic MRI with and without IV contrast, as clinically warranted. Electronically Signed   By: Kennith CenterEric  Mansell M.D.   On: 11/05/2018 18:56    Assessment: Principal Problem:   Retained products of conception following misoprostol abortion   Plan: Patient will undergo surgical management with Dilation and Evacuation.   The risks of surgery were discussed in detail with the patient including but not limited to: bleeding, infection, injury to surrounding organs, need for additional procedures, possibility of intrauterine scarring which may impair future fertility, risk of retained products which may require further management and other postoperative/anesthesia complications were explained to patient.  Likelihood of success of complete evacuation of the uterus was discussed with the patient.  Written informed consent was obtained.  Patient has been NPO as instructed  she will remain NPO for procedure. Anesthesia and OR aware.  Preoperative prophylactic Doxycycline 200mg  IV  has been ordered and is on call to the OR.  To OR when ready.     Jaynie CollinsUGONNA  Demetris Meinhardt, MD, FACOG Obstetrician & Gynecologist, [facility] [employer], [facility]

## 2018-11-08 NOTE — Op Note (Signed)
Nichole Kirk PROCEDURE DATE: 11/08/2018  PREOPERATIVE DIAGNOSIS: Retained products of conception following misoprostol abortion POSTOPERATIVE DIAGNOSIS: The same PROCEDURE:   Dilation and Evacuation SURGEON:  Dr. Jaynie Collins  INDICATIONS: 38 y.o. [id number] with retained products of conception following misoprostol abortion, needing surgical completion.  Risks of surgery were discussed with the patient including but not limited to: bleeding which may require transfusion; infection which may require antibiotics; injury to uterus or surrounding organs; need for additional procedures including laparotomy or laparoscopy; possibility of intrauterine scarring which may impair future fertility; and other postoperative/anesthesia complications. Written informed consent was obtained.    FINDINGS:  Small uterus, moderate amounts of products of conception, specimen sent to pathology.  ANESTHESIA: General-LMA, paracervical block. ESTIMATED BLOOD LOSS: 20 ml. SPECIMENS:  Products of conception sent to pathology COMPLICATIONS:  None immediate.  PROCEDURE DETAILS:  The patient received intravenous Doxycycline while in the preoperative area.  She was then taken to the operating room where general anesthesia was administered and was found to be adequate.  After an adequate timeout was performed, she was placed in the dorsal lithotomy position and examined; then prepped and draped in the sterile manner.   Her bladder was catheterized for an unmeasured amount of clear, yellow urine. A vaginal speculum was then placed in the patient's vagina and a single tooth tenaculum was applied to the anterior lip of the cervix.  A paracervical block using 30 ml of 0.5% Marcaine was administered. The cervix was gently dilated to accommodate a 7 mm suction curette that was gently advanced to the uterine fundus.  The suction device was then activated and curette slowly rotated to clear the uterus of products of conception.   A sharp curettage was then performed to confirm complete emptying of the uterus. There was minimal bleeding noted and the tenaculum removed with good hemostasis noted.   All instruments were removed from the patient's vagina.  Sponge and instrument counts were correct times two  The patient tolerated the procedure well and was taken to the recovery area extubated, awake, and in stable condition.  The patient will be discharged to home as per PACU criteria.  Routine postoperative instructions given.  She was prescribed Percocet, Ibuprofen and Colace.  She will follow up in the office in 2-3 weeks for postoperative evaluation.   Jaynie Collins, MD, FACOG Obstetrician & Gynecologist, [facility] [employer], [facility]

## 2018-11-09 ENCOUNTER — Encounter (HOSPITAL_BASED_OUTPATIENT_CLINIC_OR_DEPARTMENT_OTHER): Payer: Self-pay | Admitting: Obstetrics & Gynecology

## 2018-11-09 NOTE — Addendum Note (Signed)
Addendum  created 11/09/18 0900 by Burna Cash, CRNA   Charge Capture section accepted, Clinical Note Signed

## 2018-11-09 NOTE — Transfer of Care (Signed)
Immediate Anesthesia Transfer of Care Note  Patient: Nichole Kirk  Procedure(s) Performed: DILATATION AND EVACUATION (N/A Vagina )  Patient Location: PACU  Anesthesia Type:General  Level of Consciousness: sedated  Airway & Oxygen Therapy: Patient Spontanous Breathing and Patient connected to face mask oxygen  Post-op Assessment: Report given to RN and Post -op Vital signs reviewed and stable  Post vital signs: Reviewed and stable  Last Vitals:  Vitals Value Taken Time  BP    Temp    Pulse    Resp    SpO2      Last Pain:  Vitals:   11/08/18 1652  TempSrc:   PainSc: 0-No pain         Complications: No apparent anesthesia complications

## 2018-11-09 NOTE — Addendum Note (Signed)
Addendum  created 11/09/18 0831 by Nishaan Stanke, Jewel Baize, CRNA   Charge Capture section accepted

## 2018-11-30 ENCOUNTER — Encounter: Payer: Self-pay | Admitting: Obstetrics & Gynecology

## 2018-11-30 ENCOUNTER — Ambulatory Visit (INDEPENDENT_AMBULATORY_CARE_PROVIDER_SITE_OTHER): Payer: Medicaid Other | Admitting: Obstetrics & Gynecology

## 2018-11-30 VITALS — BP 105/66 | HR 89 | Wt 217.0 lb

## 2018-11-30 DIAGNOSIS — Z09 Encounter for follow-up examination after completed treatment for conditions other than malignant neoplasm: Secondary | ICD-10-CM

## 2018-11-30 DIAGNOSIS — O034 Incomplete spontaneous abortion without complication: Secondary | ICD-10-CM

## 2018-11-30 DIAGNOSIS — Z9889 Other specified postprocedural states: Secondary | ICD-10-CM

## 2018-11-30 NOTE — Progress Notes (Signed)
   Subjective:     Nichole Kirk is a 38 y.o. [phone] female who presents to the clinic status post Dilation and Evacuation for Retained products of conception following misoprostol abortion. Eating a regular diet without difficulty. Bowel movements are normal. The patient is not having any pain.  The following portions of the patient's history were reviewed and updated as appropriate: allergies, current medications, past family history, past medical history, past social history, past surgical history and problem list. Last pap smear was in June 2019 and was normal (done at [facility]).  Review of Systems Pertinent items noted in HPI and remainder of comprehensive ROS otherwise negative.    Objective:    BP 105/66   Pulse 89   Wt 217 lb (98.4 kg)   BMI 30.27 kg/m  General:  alert and no distress  Abdomen: soft, bowel sounds active, non-tender  Pelvic:   deferred   11/22/2017 Surgical Pathology  - PRODUCTS OF CONCEPTION.   Assessment:    Doing well postoperatively. Operative findings again reviewed. Pathology report discussed.    Plan:     1. Continue any current medications. 2. On OCPs for contraception 3. Activity restrictions: none 4. Anticipated return to work: now. 5. Follow up as needed.     Jaynie Collins, MD, FACOG Obstetrician & Gynecologist, [facility] for [employer], [facility]

## 2018-11-30 NOTE — Patient Instructions (Signed)
Return to clinic for any scheduled appointments or for any gynecologic concerns as needed.   

## 2019-03-29 ENCOUNTER — Other Ambulatory Visit: Payer: Self-pay | Admitting: *Deleted

## 2019-03-29 DIAGNOSIS — Z20822 Contact with and (suspected) exposure to covid-19: Secondary | ICD-10-CM

## 2019-04-03 LAB — NOVEL CORONAVIRUS, NAA: SARS-CoV-2, NAA: NOT DETECTED

## 2019-04-20 ENCOUNTER — Other Ambulatory Visit: Payer: Medicaid Other

## 2019-04-28 ENCOUNTER — Other Ambulatory Visit: Payer: Self-pay | Admitting: Physician Assistant

## 2019-05-01 ENCOUNTER — Ambulatory Visit
Admission: RE | Admit: 2019-05-01 | Discharge: 2019-05-01 | Disposition: A | Discharge status: Home / Self Care | Payer: Medicaid Other | Source: Ambulatory Visit | Attending: Physician Assistant | Admitting: Physician Assistant

## 2019-05-01 ENCOUNTER — Other Ambulatory Visit: Payer: Self-pay | Admitting: Physician Assistant

## 2019-05-01 ENCOUNTER — Other Ambulatory Visit: Payer: Self-pay

## 2019-05-01 DIAGNOSIS — N6310 Unspecified lump in the right breast, unspecified quadrant: Secondary | ICD-10-CM

## 2019-05-31 ENCOUNTER — Other Ambulatory Visit: Payer: Self-pay

## 2019-05-31 DIAGNOSIS — Z20822 Contact with and (suspected) exposure to covid-19: Secondary | ICD-10-CM

## 2019-06-01 LAB — NOVEL CORONAVIRUS, NAA: SARS-CoV-2, NAA: NOT DETECTED

## 2019-06-13 LAB — OB RESULTS CONSOLE GC/CHLAMYDIA
Chlamydia: NEGATIVE
Gonorrhea: NEGATIVE

## 2019-06-13 LAB — OB RESULTS CONSOLE ABO/RH: RH Type: POSITIVE

## 2019-06-13 LAB — OB RESULTS CONSOLE HIV ANTIBODY (ROUTINE TESTING): HIV: NONREACTIVE

## 2019-06-13 LAB — OB RESULTS CONSOLE RUBELLA ANTIBODY, IGM: Rubella: IMMUNE

## 2019-06-13 LAB — OB RESULTS CONSOLE RPR: RPR: NONREACTIVE

## 2019-06-13 LAB — OB RESULTS CONSOLE HEPATITIS B SURFACE ANTIGEN: Hepatitis B Surface Ag: NEGATIVE

## 2019-06-13 LAB — OB RESULTS CONSOLE ANTIBODY SCREEN: Antibody Screen: NEGATIVE

## 2019-08-26 ENCOUNTER — Encounter (HOSPITAL_COMMUNITY): Payer: Self-pay

## 2019-08-26 ENCOUNTER — Emergency Department (HOSPITAL_COMMUNITY)
Admission: AD | Admit: 2019-08-26 | Discharge: 2019-08-26 | Disposition: A | Discharge status: Home / Self Care | Payer: Medicaid Other | Attending: Emergency Medicine | Admitting: Emergency Medicine

## 2019-08-26 DIAGNOSIS — N760 Acute vaginitis: Secondary | ICD-10-CM | POA: Insufficient documentation

## 2019-08-26 DIAGNOSIS — J45909 Unspecified asthma, uncomplicated: Secondary | ICD-10-CM | POA: Diagnosis not present

## 2019-08-26 DIAGNOSIS — B9689 Other specified bacterial agents as the cause of diseases classified elsewhere: Secondary | ICD-10-CM

## 2019-08-26 DIAGNOSIS — I951 Orthostatic hypotension: Secondary | ICD-10-CM

## 2019-08-26 DIAGNOSIS — Z20828 Contact with and (suspected) exposure to other viral communicable diseases: Secondary | ICD-10-CM | POA: Diagnosis not present

## 2019-08-26 DIAGNOSIS — O99519 Diseases of the respiratory system complicating pregnancy, unspecified trimester: Secondary | ICD-10-CM

## 2019-08-26 DIAGNOSIS — M549 Dorsalgia, unspecified: Secondary | ICD-10-CM

## 2019-08-26 DIAGNOSIS — O99512 Diseases of the respiratory system complicating pregnancy, second trimester: Secondary | ICD-10-CM

## 2019-08-26 DIAGNOSIS — M545 Low back pain: Secondary | ICD-10-CM | POA: Diagnosis not present

## 2019-08-26 DIAGNOSIS — O368120 Decreased fetal movements, second trimester, not applicable or unspecified: Secondary | ICD-10-CM

## 2019-08-26 DIAGNOSIS — O99891 Other specified diseases and conditions complicating pregnancy: Secondary | ICD-10-CM | POA: Diagnosis not present

## 2019-08-26 DIAGNOSIS — R0602 Shortness of breath: Secondary | ICD-10-CM | POA: Insufficient documentation

## 2019-08-26 DIAGNOSIS — K219 Gastro-esophageal reflux disease without esophagitis: Secondary | ICD-10-CM

## 2019-08-26 DIAGNOSIS — Z3A19 19 weeks gestation of pregnancy: Secondary | ICD-10-CM | POA: Diagnosis not present

## 2019-08-26 LAB — URINALYSIS, ROUTINE W REFLEX MICROSCOPIC
Bilirubin Urine: NEGATIVE
Glucose, UA: NEGATIVE mg/dL
Hgb urine dipstick: NEGATIVE
Ketones, ur: NEGATIVE mg/dL
Leukocytes,Ua: NEGATIVE
Nitrite: NEGATIVE
Protein, ur: 30 mg/dL — AB
Specific Gravity, Urine: 1.028 (ref 1.005–1.030)
pH: 5 (ref 5.0–8.0)

## 2019-08-26 LAB — CBC WITH DIFFERENTIAL/PLATELET
Abs Immature Granulocytes: 0.08 10*3/uL — ABNORMAL HIGH (ref 0.00–0.07)
Basophils Absolute: 0 10*3/uL (ref 0.0–0.1)
Basophils Relative: 0 %
Eosinophils Absolute: 0.2 10*3/uL (ref 0.0–0.5)
Eosinophils Relative: 2 %
HCT: 34.8 % — ABNORMAL LOW (ref 36.0–46.0)
Hemoglobin: 11.3 g/dL — ABNORMAL LOW (ref 12.0–15.0)
Immature Granulocytes: 1 %
Lymphocytes Relative: 24 %
Lymphs Abs: 2.9 10*3/uL (ref 0.7–4.0)
MCH: 29.1 pg (ref 26.0–34.0)
MCHC: 32.5 g/dL (ref 30.0–36.0)
MCV: 89.7 fL (ref 80.0–100.0)
Monocytes Absolute: 0.8 10*3/uL (ref 0.1–1.0)
Monocytes Relative: 7 %
Neutro Abs: 8.2 10*3/uL — ABNORMAL HIGH (ref 1.7–7.7)
Neutrophils Relative %: 66 %
Platelets: 216 10*3/uL (ref 150–400)
RBC: 3.88 MIL/uL (ref 3.87–5.11)
RDW: 12.6 % (ref 11.5–15.5)
WBC: 12.1 10*3/uL — ABNORMAL HIGH (ref 4.0–10.5)
nRBC: 0 % (ref 0.0–0.2)

## 2019-08-26 LAB — COMPREHENSIVE METABOLIC PANEL
ALT: 59 U/L — ABNORMAL HIGH (ref 0–44)
AST: 39 U/L (ref 15–41)
Albumin: 2.7 g/dL — ABNORMAL LOW (ref 3.5–5.0)
Alkaline Phosphatase: 56 U/L (ref 38–126)
Anion gap: 9 (ref 5–15)
BUN: 7 mg/dL (ref 6–20)
CO2: 21 mmol/L — ABNORMAL LOW (ref 22–32)
Calcium: 9.2 mg/dL (ref 8.9–10.3)
Chloride: 105 mmol/L (ref 98–111)
Creatinine, Ser: 0.56 mg/dL (ref 0.44–1.00)
GFR calc Af Amer: >60 mL/min (ref >60)
GFR calc non Af Amer: >60 mL/min (ref >60)
Glucose, Bld: 109 mg/dL — ABNORMAL HIGH (ref 70–99)
Potassium: 3.6 mmol/L (ref 3.5–5.1)
Sodium: 135 mmol/L (ref 135–145)
Total Bilirubin: 0.4 mg/dL (ref 0.3–1.2)
Total Protein: 5.8 g/dL — ABNORMAL LOW (ref 6.5–8.1)

## 2019-08-26 LAB — WET PREP, GENITAL
Sperm: NONE SEEN
Trich, Wet Prep: NONE SEEN
Yeast Wet Prep HPF POC: NONE SEEN

## 2019-08-26 LAB — CBG MONITORING, ED: Glucose-Capillary: 96 mg/dL (ref 70–99)

## 2019-08-26 LAB — GLUCOSE, CAPILLARY: Glucose-Capillary: 98 mg/dL (ref 70–99)

## 2019-08-26 MED ORDER — ESOMEPRAZOLE MAGNESIUM 20 MG PO CPDR: 20.0000 mg | DELAYED_RELEASE_CAPSULE | Freq: Every day | ORAL | 0 refills | Status: DC

## 2019-08-26 MED ORDER — PANTOPRAZOLE SODIUM 40 MG PO TBEC
40.0000 mg | DELAYED_RELEASE_TABLET | Freq: Every day | ORAL | Status: DC
  Administered 2019-08-26: 40 mg via ORAL
  Filled 2019-08-26: qty 1

## 2019-08-26 MED ORDER — PREDNISONE 20 MG PO TABS
60.0000 mg | ORAL_TABLET | Freq: Once | ORAL | Status: AC
  Administered 2019-08-26: 60 mg via ORAL
  Filled 2019-08-26: qty 3

## 2019-08-26 MED ORDER — ALBUTEROL SULFATE HFA 108 (90 BASE) MCG/ACT IN AERS
4.0000 | INHALATION_SPRAY | Freq: Once | RESPIRATORY_TRACT | Status: AC
  Administered 2019-08-26: 4 via RESPIRATORY_TRACT
  Filled 2019-08-26: qty 6.7

## 2019-08-26 MED ORDER — SODIUM CHLORIDE 0.9 % IV BOLUS
1000.0000 mL | Freq: Once | INTRAVENOUS | Status: AC
  Administered 2019-08-26: 1000 mL via INTRAVENOUS

## 2019-08-26 MED ORDER — ACETAMINOPHEN 500 MG PO TABS
1000.0000 mg | ORAL_TABLET | Freq: Once | ORAL | Status: AC
  Administered 2019-08-26: 1000 mg via ORAL
  Filled 2019-08-26: qty 2

## 2019-08-26 MED ORDER — PREDNISONE 20 MG PO TABS: 20.0000 mg | ORAL_TABLET | Freq: Every day | ORAL | 0 refills | Status: AC

## 2019-08-26 MED ORDER — METRONIDAZOLE 0.75 % VA GEL: 1.0000 | Freq: Every day | VAGINAL | 0 refills | Status: DC

## 2019-08-26 NOTE — ED Provider Notes (Signed)
[facility] Provider Note   CSN: [id number] Arrival date & time: 08/26/19  0129     History   Chief Complaint Chief Complaint  Patient presents with  . Shortness of Breath  . Back Pain    HPI Nichole RochesterKirsten Danielle Shenker is a 38 y.o. female presenting for evaluation of shortness of breath.  Patient states over the past 4 days, she has been getting gradually worsening shortness of breath.  Shortness of breath is present when she lays flat or with exertion.  No shortness of breath at rest.  Patient also states since the beginning of her pregnancy, when she goes from sitting to standing clinically, she gets dizzy.  This resolves if she sits down for a minute and retries.  She has no dizziness currently.  She denies fevers, chills, nasal congestion, sore throat, chest pain, cough, nausea, vomiting.  Last night she had decreased fetal movement and severe lower back pain, as such she was seen at the MAU.  Fetal heart tones were reassuring and patient was cleared from an OB perspective.  Patient is in the ED today solely for further work-up of her shortness of breath.  She has a history of asthma, takes Symbicort twice a day and albuterol as needed.  She has used her albuterol 4 times in the past 24 hours, but states she uses 1 puff at a time.  She has no other medical problems.  She is [id number].  She is considered high risk due to advanced maternal age and high number of previous miscarriages.  Patient states she has been having issues with reflux during her pregnancy, which is not uncommon for her.  She is not currently on anything.  She has been on Nexium in previous pregnancies for this.  She denies sick contacts or contact with known COVID-19 positive person.  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  Additional history obtained from chart review.  Reviewed note from MAU.  Patient had a pelvic exam with discharge, noted to have BV.  Gonorrhea chlamydia pending.   Urine was negative.     HPI  Past Medical History:  Diagnosis Date  . Abnormal Pap smear   . Anxiety 2012   no meds   . Asthma    albuterol inhaler  . Gonorrhea 2007  . Ovarian cyst   . Urinary tract infection     Patient Active Problem List   Diagnosis Date Noted  . 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 HYPERLIPIDEMIA 06/17/2010  . ASTHMA 06/17/2010    Past Surgical History:  Procedure Laterality Date  . CESAREAN SECTION  2010  . CESAREAN SECTION  07/29/2011   Procedure: CESAREAN SECTION;  Surgeon: Almon HerculesKendra H. Ross;  Location: [facility];  Service: Gynecology;  Laterality: N/A;  Repeat  . CHOLECYSTECTOMY    . DILATION AND CURETTAGE OF UTERUS    . DILATION AND EVACUATION N/A 11/22/2017   Procedure: DILATATION AND EVACUATION;  Surgeon: Levi AlandAnderson, Mark E, MD;  Location: [facility];  Service: Gynecology;  Laterality: N/A;  . DILATION AND EVACUATION N/A 11/08/2018   Procedure: DILATATION AND EVACUATION;  Surgeon: Tereso NewcomerAnyanwu, Ugonna A, MD;  Location: [facility];  Service: Gynecology;  Laterality: N/A;  . mab     x 1 - no surgery required 2009  . WISDOM TOOTH EXTRACTION       OB History    Gravida  7   Para  2   Term  2   Preterm  0   AB  4   Living  3     SAB  2   TAB  2   Ectopic  0   Multiple  1   Live Births  2            Home Medications    Prior to Admission medications   Medication Sig Start Date End Date Taking? Authorizing Provider  acetaminophen (TYLENOL) 325 MG tablet Take 650 mg by mouth every 6 (six) hours as needed.   Yes [provider]  albuterol (PROVENTIL HFA;VENTOLIN HFA) 108 (90 Base) MCG/ACT inhaler Inhale 1-2 puffs into the lungs every 6 (six) hours as needed for wheezing or shortness of breath.   Yes [provider]  budesonide-formoterol (SYMBICORT) 160-4.5 MCG/ACT inhaler Inhale 2 puffs into the lungs 2 (two) times daily. 08/21/14  Yes Emi Belfast, FNP  Prenatal Vit-Fe Fumarate-FA  (MULTIVITAMIN-PRENATAL) 27-0.8 MG TABS tablet Take 1 tablet by mouth daily at 12 noon.   Yes [provider]  budesonide-formoterol (SYMBICORT) 80-4.5 MCG/ACT inhaler Inhale into the lungs.    [provider]  cephALEXin (KEFLEX) 500 MG capsule TAKE 1 CAPSULE BY MOUTH TWICE DAILY FOR 7 DAYS 09/23/18   [provider]  docusate sodium (COLACE) 100 MG capsule Take 1 capsule (100 mg total) by mouth 2 (two) times daily as needed for mild constipation or moderate constipation. Patient not taking: Reported on 11/30/2018 11/08/18   Anyanwu, Jethro Bastos, MD  ibuprofen (ADVIL,MOTRIN) 800 MG tablet Take 1 tablet (800 mg total) by mouth 3 (three) times daily with meals as needed for headache, moderate pain or cramping. Patient not taking: Reported on 11/30/2018 11/08/18   Tereso Newcomer, MD  metroNIDAZOLE (METROGEL VAGINAL) 0.75 % vaginal gel Place 1 Applicatorful vaginally at bedtime. Insert one applicator, at bedtime, for 5 nights. 08/26/19   Gerrit Heck, CNM  Norgestimate-Ethinyl Estradiol Triphasic (TRI-SPRINTEC) 0.18/0.215/0.25 MG-35 MCG tablet Take 1 tablet by mouth daily.    [provider]  ondansetron (ZOFRAN ODT) 8 MG disintegrating tablet Take 1 tablet (8 mg total) by mouth every 8 (eight) hours as needed for nausea or vomiting. Patient not taking: Reported on 11/30/2018 11/05/18   Rolm Bookbinder, CNM  oxyCODONE-acetaminophen (PERCOCET/ROXICET) 5-325 MG tablet Take 1 tablet by mouth every 4 (four) hours as needed for severe pain. Patient not taking: Reported on 11/30/2018 11/08/18   Tereso Newcomer, MD    Family History Family History  Problem Relation Age of Onset  . Cancer Mother        Breast  . Diabetes Mother   . Breast cancer Mother   . Diabetes Maternal Grandfather   . Cancer Maternal Grandmother        Breast  . Lupus Maternal Grandmother   . Breast cancer Maternal Grandmother   . Cancer Father   . Multiple sclerosis Sister   . Cancer Paternal Aunt         breast  . Cancer Paternal Aunt        breast  . Cancer Paternal Aunt        ovarian    Social History Social History   Tobacco Use  . Smoking status: Never Smoker  . Smokeless tobacco: Never Used  Substance Use Topics  . Alcohol use: No    Alcohol/week: 1.0 - 3.0 standard drinks    Types: 1 - 3 Cans of beer per week    Frequency: Never    Comment: socially but none with  pregnancy  . Drug use: No     Allergies   Aspirin and Mushroom extract complex   Review of Systems Review of Systems  Respiratory: Positive for shortness of breath (intermittent).   Musculoskeletal: Positive for back pain.  Neurological: Positive for dizziness (intermittent).  All other systems reviewed and are negative.    Physical Exam Updated Vital Signs BP 106/72   Pulse 72   Temp 98.2 F (36.8 C) (Oral)   Resp 20   LMP  (Exact Date)   SpO2 99%   Physical Exam Vitals signs and nursing note reviewed.  Constitutional:      General: She is not in acute distress.    Appearance: She is well-developed.     Comments: Appears uncomfortable due to back pain. Nontoxic in appearance.   HENT:     Head: Normocephalic and atraumatic.  Eyes:     Conjunctiva/sclera: Conjunctivae normal.     Pupils: Pupils are equal, round, and reactive to light.  Neck:     Musculoskeletal: Normal range of motion and neck supple.  Cardiovascular:     Rate and Rhythm: Normal rate and regular rhythm.     Pulses: Normal pulses.  Pulmonary:     Effort: Pulmonary effort is normal. No respiratory distress.     Breath sounds: Normal breath sounds. No wheezing.     Comments: Clear lung sounds in all fields. Speaking in short sentences. sats 99-100 on RA. No respiratory distress.  Abdominal:     General: There is no distension.     Palpations: Abdomen is soft. There is no mass.     Tenderness: There is no abdominal tenderness. There is no guarding or rebound.  Musculoskeletal: Normal range of motion.     Right  lower leg: No edema.     Left lower leg: No edema.     Comments: No leg pain or swelling  Skin:    General: Skin is warm and dry.     Capillary Refill: Capillary refill takes less than 2 seconds.  Neurological:     Mental Status: She is alert and oriented to person, place, and time.      ED Treatments / Results  Labs (all labs ordered are listed, but only abnormal results are displayed) Labs Reviewed  WET PREP, GENITAL - Abnormal; Notable for the following components:      Result Value   Clue Cells Wet Prep HPF POC PRESENT (*)    WBC, Wet Prep HPF POC MODERATE (*)    All other components within normal limits  URINALYSIS, ROUTINE W REFLEX MICROSCOPIC - Abnormal; Notable for the following components:   APPearance HAZY (*)    Protein, ur 30 (*)    Bacteria, UA RARE (*)    All other components within normal limits  GLUCOSE, CAPILLARY  CBC WITH DIFFERENTIAL/PLATELET  COMPREHENSIVE METABOLIC PANEL  GC/CHLAMYDIA PROBE AMP ([place]) NOT AT [facility]    EKG None  Radiology No results found.  Procedures Procedures (including critical care time)  Medications Ordered in ED Medications  pantoprazole (PROTONIX) EC tablet 40 mg (has no administration in time range)  acetaminophen (TYLENOL) tablet 1,000 mg (1,000 mg Oral Given 08/26/19 0251)  predniSONE (DELTASONE) tablet 60 mg (60 mg Oral Given 08/26/19 0749)  albuterol (VENTOLIN HFA) 108 (90 Base) MCG/ACT inhaler 4 puff (4 puffs Inhalation Given 08/26/19 0751)     Initial Impression / Assessment and Plan / ED Course  I have reviewed the triage vital signs and the nursing  notes.  Pertinent labs & imaging results that were available during my care of the patient were reviewed by me and considered in my medical decision making (see chart for details).        Patient presenting for evaluation of shortness of breath.  Physical exam reassuring, she appears nontoxic.  Sats stable.  No signs of respiratory distress.  As she is  pregnant, patient is slightly higher risk for PE.  No other symptoms or URI symptoms consistent with coronavirus, though considering the widespread infection in the community, is a possibility.  As symptoms are worse at night, consider reflux causing worsened asthma symptoms.  Patient reporting dizziness when she goes from sitting to standing, as such, will obtain orthostatic signs.  Will obtain basic blood work to assure no anemia.  Will hold on imaging at this time due to pregnancy.  Will treat asthma with prednisone, albuterol, Protonix.  Orthostatics positive, patient's blood pressure dropped to the 70s.  Fluid bolus given.  This is likely contributing to her shortness of breath with exertion.  Reassessment, patient reports she remains asymptomatic, is lying on her side.  Will ambulate patient to assure no drop in sats with ambulation.  Discussed possibility of PE, although at this time I favor asthma, GERD, and orthostatic hypotension.  Discussed option of imaging, patient does not want imaging at this time.  I encouraged patient to continue to monitor her symptoms, and if she becomes more dyspneic and has chest pain, she will likely need further work-up.  Patient is agreeable to this plan.  Patient ambulated without drop in sats or significant dyspnea.  Patient feels ready to go home.  Will treat asthma symptomatically with prednisone and increased albuterol use.  Will give Nexium for GERD.  Patient has an appointment on Monday with her OB/GYN, encouraged to follow-up.  At this time, patient appears safe for discharge.  Return precautions given.  Patient states she understands and agrees to plan.  Final Clinical Impressions(s) / ED Diagnoses   Final diagnoses:  [redacted] weeks gestation of pregnancy  Back pain complicating pregnancy in second trimester  Decreased fetal movements in second trimester, single or unspecified fetus  Bacterial vaginosis  Dizziness    ED Discharge Orders         Ordered     metroNIDAZOLE (METROGEL VAGINAL) 0.75 % vaginal gel  Daily at bedtime     08/26/19 0335           Alveria Apley, PA-C 08/26/19 1153    Tegeler, Canary Brim, MD 08/26/19 1625

## 2019-08-26 NOTE — MAU Note (Signed)
[facility] charge nurse Otila Kluver notified of patient coming and Dr. Dayna Barker accepting .

## 2019-08-26 NOTE — MAU Note (Signed)
Pt presents to MAU c/o SOB that has been ongoing thru the pregnancy but has worsened today. Pt has used her inhaler x4 times today. Pt respirations are 22. No bleeding or LOF. No FM for 24 hours. Pt reports lower back pain.

## 2019-08-26 NOTE — MAU Provider Note (Signed)
History     CSN: [id number]  Arrival date and time: 08/26/19 9147   First Provider Initiated Contact with Patient 08/26/19 0205      Chief Complaint  Patient presents with  . Decreased Fetal Movement  . Shortness of Breath  . Back Pain   Nichole Kirk is a 38 y.o. [id number] at [redacted]w[redacted]d who receives care at [facility].  She presents today for Decreased Fetal Movement, Shortness of Breath, and Back Pain.  She states the back pain started "a couple of days ago and has been constant for the last 12 hours."  Patient reports taking tylenol, using heating pads, and cold compresses without relief.  She describes the pain as a shooting pain and rates it a 9/10.  She reports taking tylenol around 3pm.  She reports that she has not felt fetal movement and admits that she has only started moving in the last week.  She states "he will start fluttering" around the same time every night, but has not felt movement since Friday morning at 1am.  She reports that she tried to "push around," but was not able to ilicit movement.  She reports she has been experiencing SOB since last week.  She contributed it to pregnancy and recognizes that she has a history of Asthma.  She states she has been waking up, since Tuesday, gasping for air. She states she has changed her sleeping pattern and is now sleeping upright without relief.  She reports that she has taken "4 hits" of her rescue inhaler and took her symbicort prior to arrival.  She states that she feels fine sitting upright in MAU, but expresses concern about how much albuterol she has had to use today and about waking up feeling SOB. Patient denies potential exposure to COVID, but records show 2 tests in the past.  She states that her ex-husband exposed her children to COVID and she had the entire household tested as a precaution. She reports that usually she does not go anywhere and denies potential exposure.       OB History    Gravida  7   Para   2   Term  2   Preterm  0   AB  4   Living  3     SAB  2   TAB  2   Ectopic  0   Multiple  1   Live Births  2           Past Medical History:  Diagnosis Date  . Abnormal Pap smear   . Anxiety 2012   no meds   . Asthma    albuterol inhaler  . Gonorrhea 2007  . Ovarian cyst   . Urinary tract infection     Past Surgical History:  Procedure Laterality Date  . CESAREAN SECTION  2010  . CESAREAN SECTION  07/29/2011   Procedure: CESAREAN SECTION;  Surgeon: Almon Hercules;  Location: [facility];  Service: Gynecology;  Laterality: N/A;  Repeat  . CHOLECYSTECTOMY    . DILATION AND CURETTAGE OF UTERUS    . DILATION AND EVACUATION N/A 11/22/2017   Procedure: DILATATION AND EVACUATION;  Surgeon: Levi Aland, MD;  Location: [facility];  Service: Gynecology;  Laterality: N/A;  . DILATION AND EVACUATION N/A 11/08/2018   Procedure: DILATATION AND EVACUATION;  Surgeon: Tereso Newcomer, MD;  Location: [facility];  Service: Gynecology;  Laterality: N/A;  . mab     x 1 -  no surgery required 2009  . WISDOM TOOTH EXTRACTION      Family History  Problem Relation Age of Onset  . Cancer Mother        Breast  . Diabetes Mother   . Breast cancer Mother   . Diabetes Maternal Grandfather   . Cancer Maternal Grandmother        Breast  . Lupus Maternal Grandmother   . Breast cancer Maternal Grandmother   . Cancer Father   . Multiple sclerosis Sister   . Cancer Paternal Aunt        breast  . Cancer Paternal Aunt        breast  . Cancer Paternal Aunt        ovarian    Social History   Tobacco Use  . Smoking status: Never Smoker  . Smokeless tobacco: Never Used  Substance Use Topics  . Alcohol use: No    Alcohol/week: 1.0 - 3.0 standard drinks    Types: 1 - 3 Cans of beer per week    Frequency: Never    Comment: socially but none with pregnancy  . Drug use: No    Allergies:  Allergies  Allergen Reactions  . Aspirin Anaphylaxis    Pt says she can  take ibuprofen  . Mushroom Extract Complex Swelling and Rash    Medications Prior to Admission  Medication Sig Dispense Refill Last Dose  . acetaminophen (TYLENOL) 325 MG tablet Take 650 mg by mouth every 6 (six) hours as needed.   08/25/2019 at Unknown time  . albuterol (PROVENTIL HFA;VENTOLIN HFA) 108 (90 Base) MCG/ACT inhaler Inhale 1-2 puffs into the lungs every 6 (six) hours as needed for wheezing or shortness of breath.   08/26/2019 at Unknown time  . budesonide-formoterol (SYMBICORT) 160-4.5 MCG/ACT inhaler Inhale 2 puffs into the lungs 2 (two) times daily. 1 Inhaler 11 08/26/2019 at Unknown time  . Prenatal Vit-Fe Fumarate-FA (MULTIVITAMIN-PRENATAL) 27-0.8 MG TABS tablet Take 1 tablet by mouth daily at 12 noon.   08/25/2019 at Unknown time  . budesonide-formoterol (SYMBICORT) 80-4.5 MCG/ACT inhaler Inhale into the lungs.     . cephALEXin (KEFLEX) 500 MG capsule TAKE 1 CAPSULE BY MOUTH TWICE DAILY FOR 7 DAYS     . docusate sodium (COLACE) 100 MG capsule Take 1 capsule (100 mg total) by mouth 2 (two) times daily as needed for mild constipation or moderate constipation. (Patient not taking: Reported on 11/30/2018) 30 capsule 2   . ibuprofen (ADVIL,MOTRIN) 800 MG tablet Take 1 tablet (800 mg total) by mouth 3 (three) times daily with meals as needed for headache, moderate pain or cramping. (Patient not taking: Reported on 11/30/2018) 30 tablet 3   . Norgestimate-Ethinyl Estradiol Triphasic (TRI-SPRINTEC) 0.18/0.215/0.25 MG-35 MCG tablet Take 1 tablet by mouth daily.     . ondansetron (ZOFRAN ODT) 8 MG disintegrating tablet Take 1 tablet (8 mg total) by mouth every 8 (eight) hours as needed for nausea or vomiting. (Patient not taking: Reported on 11/30/2018) 20 tablet 0   . oxyCODONE-acetaminophen (PERCOCET/ROXICET) 5-325 MG tablet Take 1 tablet by mouth every 4 (four) hours as needed for severe pain. (Patient not taking: Reported on 11/30/2018) 10 tablet 0     Review of Systems  Constitutional:  Negative for chills and fever.  HENT: Positive for congestion (Contributes to mask).   Respiratory: Positive for shortness of breath. Negative for cough.   Gastrointestinal: Negative for abdominal pain, constipation, diarrhea, nausea and vomiting.  Genitourinary: Positive for vaginal discharge. Negative  for difficulty urinating, dysuria and vaginal bleeding. Vaginal pain: States her OB states it is normal.   Musculoskeletal: Positive for back pain.  Neurological: Positive for dizziness (With standing, but states this has been ongoing throughout pregnancy. ). Negative for light-headedness and headaches.   Physical Exam   Blood pressure 112/69, pulse 98, temperature 98.2 F (36.8 C), temperature source Oral, resp. rate (!) 22, unknown if currently breastfeeding.  Physical Exam  Constitutional: She is oriented to person, place, and time. She appears well-developed and well-nourished. No distress.  HENT:  Head: Normocephalic and atraumatic.  Eyes: Conjunctivae are normal.  Neck: Normal range of motion.  Cardiovascular: Normal rate. A regularly irregular rhythm present.  Respiratory: Effort normal and breath sounds normal. No respiratory distress. She exhibits no tenderness.  GI: Soft. Bowel sounds are normal. There is no abdominal tenderness.  Genitourinary: Uterus is enlarged. Cervix exhibits motion tenderness. Cervix exhibits no discharge.    Vaginal discharge present.     No vaginal bleeding.  No bleeding in the vagina.    Genitourinary Comments: Speculum Exam: -Normal External Genitalia: Non tender, Scant amt white discharge at introitus.  -Vaginal Vault: Pink mucosa with good rugae. Moderate amt thick white discharge -wet prep collected -Cervix:Pink, no lesions, cysts, or polyps.  Appears closed. Active bleeding from os after collection of GC/CT collected -Bimanual Exam:  +CMT, Closed/ Long/Soft    Musculoskeletal:     Lumbar back: She exhibits tenderness.  Neurological: She is  alert and oriented to person, place, and time.  Skin: Skin is warm and dry.  Psychiatric: She has a normal mood and affect. Her behavior is normal.    MAU Course  Procedures Results for orders placed or performed during the hospital encounter of 08/26/19 (from the past 24 hour(s))  Urinalysis, Routine w reflex microscopic     Status: Abnormal   Collection Time: 08/26/19  2:40 AM  Result Value Ref Range   Color, Urine YELLOW YELLOW   APPearance HAZY (A) CLEAR   Specific Gravity, Urine 1.028 1.005 - 1.030   pH 5.0 5.0 - 8.0   Glucose, UA NEGATIVE NEGATIVE mg/dL   Hgb urine dipstick NEGATIVE NEGATIVE   Bilirubin Urine NEGATIVE NEGATIVE   Ketones, ur NEGATIVE NEGATIVE mg/dL   Protein, ur 30 (A) NEGATIVE mg/dL   Nitrite NEGATIVE NEGATIVE   Leukocytes,Ua NEGATIVE NEGATIVE   RBC / HPF 0-5 0 - 5 RBC/hpf   WBC, UA 0-5 0 - 5 WBC/hpf   Bacteria, UA RARE (A) NONE SEEN   Squamous Epithelial / LPF 11-20 0 - 5   Mucus PRESENT    Ca Oxalate Crys, UA PRESENT   Wet prep, genital     Status: Abnormal   Collection Time: 08/26/19  2:40 AM  Result Value Ref Range   Yeast Wet Prep HPF POC NONE SEEN NONE SEEN   Trich, Wet Prep NONE SEEN NONE SEEN   Clue Cells Wet Prep HPF POC PRESENT (A) NONE SEEN   WBC, Wet Prep HPF POC MODERATE (A) NONE SEEN   Sperm NONE SEEN   Glucose, capillary     Status: None   Collection Time: 08/26/19  3:13 AM  Result Value Ref Range   Glucose-Capillary 98 70 - 99 mg/dL    MDM Physical Exam Labs: UA, Wet Prep, GC/CT Doppler Pain Medication Heat Application Assessment and Plan  38 year old, [id number]  SIUP at 19weeks DFM Back Pain SOB Dizziness  -Reassured that fetal heart rate heard on doppler, by nurse, at  141bpm.  -Reviewed POC with patient, patient initially did not feel pelvic exam necessary. However, patient agreeable after sudden onset of back pain and educated on how STI need to be ruled out.  -Acknowledged that SOB complaint is heard, but [facility]  [facility]. -Reviewed SpO2 status of 98-99% on RA. -Reviewed how unable to give muscle relaxant or narcotics because patient drove self. -Discussed dizziness and informed that this is not normal pregnancy occurrence and maybe related to SOB.  -Exam performed and findings discussed.  -Cultures collected and pending.  -Offer and accepts tylenol for back pain. -Will apply heating pad to area. -Patient informed to let provider know if [facility]  -Will collect CBG -Will await results.    Nichole RobinsJessica L Amazin Pincock, MSN, CNM 08/26/2019, 2:05 AM   Reassessment (3:03 AM) Bacterial Vaginosis SOB  -Wet prep returns significant for clue cells. -Rx for Metrogel sent to pharmacy.  -Results discussed with patient. -Patient reports having continued back pain, but notes that the bed is uncomfortable. -She also reports continued SOB and confirms that she would like further evaluation.  -Patient reports that she has an appt, with her primary ob, on Monday.  -Patient without further questions or concerns.  -[facility] called and Dr. Hilliard ClarkMessener received report for the patient.  We appreciate his role in Ms. Heidemann's care.  -Transfer by nurse.  Nichole RobinsJessica L Charde Macfarlane MSN, CNM Advanced Practice Provider, [facility]

## 2019-08-26 NOTE — Discharge Instructions (Addendum)
Take prednisone for the next 3 days starting tomorrow. Use your inhaler, 2 puffs, every 4 hours for the next 2 days.  After this, use only as needed for shortness of breath or chest tightness. Make sure you are staying well-hydrated with water.  Your urine should be clear to pale yellow.  If you are feeling dizzy when you stand up, this is likely an indication that you are dehydrated and need more water. Take Nexium daily to decrease stomach acid. Follow-up with your OB/GYN at your scheduled appointment on Monday for reassessment of your symptoms. You were tested for coronavirus today.  Results should return in the next day or 2.  If positive, you will see a phone call.  If negative, you will not.  Either way, you may check online on MyChart. Return to the emergency room if you develop chest pain and worsening shortness of breath. Return to the emergency room with any new, worsening, concerning symptoms.

## 2019-08-26 NOTE — ED Notes (Signed)
Patient ambulated in hallway, O2 remained at 100% while ambulating, NAD noted.

## 2019-08-27 LAB — NOVEL CORONAVIRUS, NAA (HOSP ORDER, SEND-OUT TO REF LAB; TAT 18-24 HRS): SARS-CoV-2, NAA: NOT DETECTED

## 2019-08-28 LAB — GC/CHLAMYDIA PROBE AMP (~~LOC~~) NOT AT ARMC
Chlamydia: NEGATIVE
Comment: NEGATIVE
Comment: NORMAL
Neisseria Gonorrhea: NEGATIVE

## 2019-11-02 ENCOUNTER — Other Ambulatory Visit: Payer: Medicaid Other

## 2019-11-12 ENCOUNTER — Other Ambulatory Visit: Payer: Self-pay

## 2019-11-12 ENCOUNTER — Inpatient Hospital Stay (HOSPITAL_COMMUNITY)
Admission: AD | Admit: 2019-11-12 | Discharge: 2019-11-12 | Disposition: A | Discharge status: Home / Self Care | Payer: Medicaid Other | Attending: Obstetrics and Gynecology | Admitting: Obstetrics and Gynecology

## 2019-11-12 ENCOUNTER — Encounter (HOSPITAL_COMMUNITY): Payer: Self-pay | Admitting: Obstetrics and Gynecology

## 2019-11-12 DIAGNOSIS — O09523 Supervision of elderly multigravida, third trimester: Secondary | ICD-10-CM | POA: Diagnosis not present

## 2019-11-12 DIAGNOSIS — O479 False labor, unspecified: Secondary | ICD-10-CM | POA: Diagnosis not present

## 2019-11-12 DIAGNOSIS — O4703 False labor before 37 completed weeks of gestation, third trimester: Secondary | ICD-10-CM | POA: Diagnosis not present

## 2019-11-12 DIAGNOSIS — Z3A30 30 weeks gestation of pregnancy: Secondary | ICD-10-CM

## 2019-11-12 DIAGNOSIS — O4700 False labor before 37 completed weeks of gestation, unspecified trimester: Secondary | ICD-10-CM

## 2019-11-12 LAB — URINALYSIS, ROUTINE W REFLEX MICROSCOPIC
Bilirubin Urine: NEGATIVE
Glucose, UA: NEGATIVE mg/dL
Hgb urine dipstick: NEGATIVE
Ketones, ur: 5 mg/dL — AB
Leukocytes,Ua: NEGATIVE
Nitrite: NEGATIVE
Protein, ur: 30 mg/dL — AB
Specific Gravity, Urine: 1.026 (ref 1.005–1.030)
pH: 5 (ref 5.0–8.0)

## 2019-11-12 LAB — CBC WITH DIFFERENTIAL/PLATELET
Abs Immature Granulocytes: 0.08 10*3/uL — ABNORMAL HIGH (ref 0.00–0.07)
Basophils Absolute: 0 10*3/uL (ref 0.0–0.1)
Basophils Relative: 0 %
Eosinophils Absolute: 0.2 10*3/uL (ref 0.0–0.5)
Eosinophils Relative: 1 %
HCT: 31.2 % — ABNORMAL LOW (ref 36.0–46.0)
Hemoglobin: 10.1 g/dL — ABNORMAL LOW (ref 12.0–15.0)
Immature Granulocytes: 1 %
Lymphocytes Relative: 19 %
Lymphs Abs: 2.4 10*3/uL (ref 0.7–4.0)
MCH: 27.9 pg (ref 26.0–34.0)
MCHC: 32.4 g/dL (ref 30.0–36.0)
MCV: 86.2 fL (ref 80.0–100.0)
Monocytes Absolute: 1.1 10*3/uL — ABNORMAL HIGH (ref 0.1–1.0)
Monocytes Relative: 9 %
Neutro Abs: 9 10*3/uL — ABNORMAL HIGH (ref 1.7–7.7)
Neutrophils Relative %: 70 %
Platelets: 223 10*3/uL (ref 150–400)
RBC: 3.62 MIL/uL — ABNORMAL LOW (ref 3.87–5.11)
RDW: 13 % (ref 11.5–15.5)
WBC: 12.8 10*3/uL — ABNORMAL HIGH (ref 4.0–10.5)
nRBC: 0 % (ref 0.0–0.2)

## 2019-11-12 LAB — COMPREHENSIVE METABOLIC PANEL
ALT: 15 U/L (ref 0–44)
AST: 18 U/L (ref 15–41)
Albumin: 2.6 g/dL — ABNORMAL LOW (ref 3.5–5.0)
Alkaline Phosphatase: 90 U/L (ref 38–126)
Anion gap: 10 (ref 5–15)
BUN: 6 mg/dL (ref 6–20)
CO2: 21 mmol/L — ABNORMAL LOW (ref 22–32)
Calcium: 9.3 mg/dL (ref 8.9–10.3)
Chloride: 105 mmol/L (ref 98–111)
Creatinine, Ser: 0.59 mg/dL (ref 0.44–1.00)
GFR calc Af Amer: >60 mL/min (ref >60)
GFR calc non Af Amer: >60 mL/min (ref >60)
Glucose, Bld: 95 mg/dL (ref 70–99)
Potassium: 4 mmol/L (ref 3.5–5.1)
Sodium: 136 mmol/L (ref 135–145)
Total Bilirubin: 0.5 mg/dL (ref 0.3–1.2)
Total Protein: 6.3 g/dL — ABNORMAL LOW (ref 6.5–8.1)

## 2019-11-12 LAB — RAPID URINE DRUG SCREEN, HOSP PERFORMED
Amphetamines: NOT DETECTED
Barbiturates: NOT DETECTED
Benzodiazepines: NOT DETECTED
Cocaine: NOT DETECTED
Opiates: NOT DETECTED
Tetrahydrocannabinol: NOT DETECTED

## 2019-11-12 MED ORDER — LACTATED RINGERS IV BOLUS
1000.0000 mL | Freq: Once | INTRAVENOUS | Status: AC
  Administered 2019-11-12: 03:00:00 1000 mL via INTRAVENOUS

## 2019-11-12 MED ORDER — NIFEDIPINE 10 MG PO CAPS
10.0000 mg | ORAL_CAPSULE | Freq: Once | ORAL | Status: AC
  Administered 2019-11-12: 10 mg via ORAL
  Filled 2019-11-12: qty 1

## 2019-11-12 MED ORDER — LACTATED RINGERS IV SOLN: INTRAVENOUS | Status: DC

## 2019-11-12 NOTE — Discharge Instructions (Signed)
Braxton Hicks Contractions °Contractions of the uterus can occur throughout pregnancy, but they are not always a sign that you are in labor. You may have practice contractions called Braxton Hicks contractions. These false labor contractions are sometimes confused with true labor. °What are Braxton Hicks contractions? °Braxton Hicks contractions are tightening movements that occur in the muscles of the uterus before labor. Unlike true labor contractions, these contractions do not result in opening (dilation) and thinning of the cervix. Toward the end of pregnancy (32-34 weeks), Braxton Hicks contractions can happen more often and may become stronger. These contractions are sometimes difficult to tell apart from true labor because they can be very uncomfortable. You should not feel embarrassed if you go to the hospital with false labor. °Sometimes, the only way to tell if you are in true labor is for your health care provider to look for changes in the cervix. The health care provider will do a physical exam and may monitor your contractions. If you are not in true labor, the exam should show that your cervix is not dilating and your water has not broken. °If there are no other health problems associated with your pregnancy, it is completely safe for you to be sent home with false labor. You may continue to have Braxton Hicks contractions until you go into true labor. °How to tell the difference between true labor and false labor °True labor °· Contractions last 30-70 seconds. °· Contractions become very regular. °· Discomfort is usually felt in the top of the uterus, and it spreads to the lower abdomen and low back. °· Contractions do not go away with walking. °· Contractions usually become more intense and increase in frequency. °· The cervix dilates and gets thinner. °False labor °· Contractions are usually shorter and not as strong as true labor contractions. °· Contractions are usually irregular. °· Contractions  are often felt in the front of the lower abdomen and in the groin. °· Contractions may go away when you walk around or change positions while lying down. °· Contractions get weaker and are shorter-lasting as time goes on. °· The cervix usually does not dilate or become thin. °Follow these instructions at home: ° °· Take over-the-counter and prescription medicines only as told by your health care provider. °· Keep up with your usual exercises and follow other instructions from your health care provider. °· Eat and drink lightly if you think you are going into labor. °· If Braxton Hicks contractions are making you uncomfortable: °? Change your position from lying down or resting to walking, or change from walking to resting. °? Sit and rest in a tub of warm water. °? Drink enough fluid to keep your urine pale yellow. Dehydration may cause these contractions. °? Do slow and deep breathing several times an hour. °· Keep all follow-up prenatal visits as told by your health care provider. This is important. °Contact a health care provider if: °· You have a fever. °· You have continuous pain in your abdomen. °Get help right away if: °· Your contractions become stronger, more regular, and closer together. °· You have fluid leaking or gushing from your vagina. °· You pass blood-tinged mucus (bloody show). °· You have bleeding from your vagina. °· You have low back pain that you never had before. °· You feel your baby’s head pushing down and causing pelvic pressure. °· Your baby is not moving inside you as much as it used to. °Summary °· Contractions that occur before labor are   called Braxton Hicks contractions, false labor, or practice contractions. °· Braxton Hicks contractions are usually shorter, weaker, farther apart, and less regular than true labor contractions. True labor contractions usually become progressively stronger and regular, and they become more frequent. °· Manage discomfort from Braxton Hicks contractions  by changing position, resting in a warm bath, drinking plenty of water, or practicing deep breathing. °This information is not intended to replace advice given to you by your health care provider. Make sure you discuss any questions you have with your health care provider. °Document Revised: 09/03/2017 Document Reviewed: 02/04/2017 °Elsevier Patient Education © 2020 [employer] ° °

## 2019-11-12 NOTE — MAU Provider Note (Signed)
History     CSN: [id number]  Arrival date and time: 11/12/19 0229   None     No chief complaint on file.  HPI   [name] is a 39 y.o. female [phone] here via EMS with contractions and anxiety. States the contractions have been present for a few days, however today the pain became worse. States the pain feels like contractions.  At 11:00 she went to the bathroom and she noticed a mucus plug in the toilet. She saw pink in the mucus plug. She denies bleeding. States she has not felt her baby move for a couple of hours, however now that she is here she is feeling her baby move. She denies bleeding or discharge at this time. She has not taken anything for the pain.   OB History    Gravida  7   Para  2   Term  2   Preterm  0   AB  4   Living  3     SAB  2   TAB  2   Ectopic  0   Multiple  1   Live Births  2           Past Medical History:  Diagnosis Date  . Abnormal Pap smear   . Anxiety 2012   no meds   . Asthma    albuterol inhaler  . Gonorrhea 2007  . Ovarian cyst   . Urinary tract infection     Past Surgical History:  Procedure Laterality Date  . CESAREAN SECTION  2010  . CESAREAN SECTION  07/29/2011   Procedure: CESAREAN SECTION;  Surgeon: Almon Hercules;  Location: [facility];  Service: Gynecology;  Laterality: N/A;  Repeat  . CHOLECYSTECTOMY    . DILATION AND CURETTAGE OF UTERUS    . DILATION AND EVACUATION N/A 11/22/2017   Procedure: DILATATION AND EVACUATION;  Surgeon: Levi Aland, MD;  Location: [facility];  Service: Gynecology;  Laterality: N/A;  . DILATION AND EVACUATION N/A 11/08/2018   Procedure: DILATATION AND EVACUATION;  Surgeon: Tereso Newcomer, MD;  Location: [facility];  Service: Gynecology;  Laterality: N/A;  . mab     x 1 - no surgery required 2009  . WISDOM TOOTH EXTRACTION      Family History  Problem Relation Age of Onset  . Cancer Mother        Breast  . Diabetes Mother   . Breast cancer  Mother   . Diabetes Maternal Grandfather   . Cancer Maternal Grandmother        Breast  . Lupus Maternal Grandmother   . Breast cancer Maternal Grandmother   . Cancer Father   . Multiple sclerosis Sister   . Cancer Paternal Aunt        breast  . Cancer Paternal Aunt        breast  . Cancer Paternal Aunt        ovarian    Social History   Tobacco Use  . Smoking status: Never Smoker  . Smokeless tobacco: Never Used  Substance Use Topics  . Alcohol use: No    Alcohol/week: 1.0 - 3.0 standard drinks    Types: 1 - 3 Cans of beer per week    Comment: socially but none with pregnancy  . Drug use: No    Allergies:  Allergies  Allergen Reactions  . Aspirin Anaphylaxis    Pt says she can take ibuprofen  . Mushroom Extract  Complex Swelling and Rash    Medications Prior to Admission  Medication Sig Dispense Refill Last Dose  . acetaminophen (TYLENOL) 325 MG tablet Take 650 mg by mouth every 6 (six) hours as needed.     Marland Kitchen albuterol (PROVENTIL HFA;VENTOLIN HFA) 108 (90 Base) MCG/ACT inhaler Inhale 1-2 puffs into the lungs every 6 (six) hours as needed for wheezing or shortness of breath.     . budesonide-formoterol (SYMBICORT) 160-4.5 MCG/ACT inhaler Inhale 2 puffs into the lungs 2 (two) times daily. 1 Inhaler 11   . budesonide-formoterol (SYMBICORT) 80-4.5 MCG/ACT inhaler Inhale into the lungs.     . cephALEXin (KEFLEX) 500 MG capsule TAKE 1 CAPSULE BY MOUTH TWICE DAILY FOR 7 DAYS     . docusate sodium (COLACE) 100 MG capsule Take 1 capsule (100 mg total) by mouth 2 (two) times daily as needed for mild constipation or moderate constipation. (Patient not taking: Reported on 11/30/2018) 30 capsule 2   . esomeprazole (NEXIUM) 20 MG capsule Take 1 capsule (20 mg total) by mouth daily. 14 capsule 0   . ibuprofen (ADVIL,MOTRIN) 800 MG tablet Take 1 tablet (800 mg total) by mouth 3 (three) times daily with meals as needed for headache, moderate pain or cramping. (Patient not taking: Reported  on 11/30/2018) 30 tablet 3   . metroNIDAZOLE (METROGEL VAGINAL) 0.75 % vaginal gel Place 1 Applicatorful vaginally at bedtime. Insert one applicator, at bedtime, for 5 nights. 70 g 0   . Norgestimate-Ethinyl Estradiol Triphasic (TRI-SPRINTEC) 0.18/0.215/0.25 MG-35 MCG tablet Take 1 tablet by mouth daily.     . ondansetron (ZOFRAN ODT) 8 MG disintegrating tablet Take 1 tablet (8 mg total) by mouth every 8 (eight) hours as needed for nausea or vomiting. (Patient not taking: Reported on 11/30/2018) 20 tablet 0   . oxyCODONE-acetaminophen (PERCOCET/ROXICET) 5-325 MG tablet Take 1 tablet by mouth every 4 (four) hours as needed for severe pain. (Patient not taking: Reported on 11/30/2018) 10 tablet 0   . Prenatal Vit-Fe Fumarate-FA (MULTIVITAMIN-PRENATAL) 27-0.8 MG TABS tablet Take 1 tablet by mouth daily at 12 noon.      Results for orders placed or performed during the hospital encounter of 11/12/19 (from the past 48 hour(s))  CBC with Differential/Platelet     Status: Abnormal   Collection Time: 11/12/19  2:39 AM  Result Value Ref Range   WBC 12.8 (H) 4.0 - 10.5 K/uL   RBC 3.62 (L) 3.87 - 5.11 MIL/uL   Hemoglobin 10.1 (L) 12.0 - 15.0 g/dL   HCT 51.0 (L) 25.8 - 52.7 %   MCV 86.2 80.0 - 100.0 fL   MCH 27.9 26.0 - 34.0 pg   MCHC 32.4 30.0 - 36.0 g/dL   RDW 78.2 42.3 - 53.6 %   Platelets 223 150 - 400 K/uL   nRBC 0.0 0.0 - 0.2 %   Neutrophils Relative % 70 %   Neutro Abs 9.0 (H) 1.7 - 7.7 K/uL   Lymphocytes Relative 19 %   Lymphs Abs 2.4 0.7 - 4.0 K/uL   Monocytes Relative 9 %   Monocytes Absolute 1.1 (H) 0.1 - 1.0 K/uL   Eosinophils Relative 1 %   Eosinophils Absolute 0.2 0.0 - 0.5 K/uL   Basophils Relative 0 %   Basophils Absolute 0.0 0.0 - 0.1 K/uL   Immature Granulocytes 1 %   Abs Immature Granulocytes 0.08 (H) 0.00 - 0.07 K/uL    Comment: Performed at [facility], [address], [place], [place] [address]  Comprehensive  metabolic panel     Status: Abnormal   Collection Time:  11/12/19  2:39 AM  Result Value Ref Range   Sodium 136 135 - 145 mmol/L   Potassium 4.0 3.5 - 5.1 mmol/L   Chloride 105 98 - 111 mmol/L   CO2 21 (L) 22 - 32 mmol/L   Glucose, Bld 95 70 - 99 mg/dL   BUN 6 6 - 20 mg/dL   Creatinine, Ser 9.92 0.44 - 1.00 mg/dL   Calcium 9.3 8.9 - 42.6 mg/dL   Total Protein 6.3 (L) 6.5 - 8.1 g/dL   Albumin 2.6 (L) 3.5 - 5.0 g/dL   AST 18 15 - 41 U/L   ALT 15 0 - 44 U/L   Alkaline Phosphatase 90 38 - 126 U/L   Total Bilirubin 0.5 0.3 - 1.2 mg/dL   GFR calc non Af Amer >60 >60 mL/min   GFR calc Af Amer >60 >60 mL/min   Anion gap 10 5 - 15    Comment: Performed at [facility], [address], [place], [place] [address]  Urinalysis, Routine w reflex microscopic     Status: Abnormal   Collection Time: 11/12/19  3:19 AM  Result Value Ref Range   Color, Urine YELLOW YELLOW   APPearance HAZY (A) CLEAR   Specific Gravity, Urine 1.026 1.005 - 1.030   pH 5.0 5.0 - 8.0   Glucose, UA NEGATIVE NEGATIVE mg/dL   Hgb urine dipstick NEGATIVE NEGATIVE   Bilirubin Urine NEGATIVE NEGATIVE   Ketones, ur 5 (A) NEGATIVE mg/dL   Protein, ur 30 (A) NEGATIVE mg/dL   Nitrite NEGATIVE NEGATIVE   Leukocytes,Ua NEGATIVE NEGATIVE   RBC / HPF 0-5 0 - 5 RBC/hpf   WBC, UA 0-5 0 - 5 WBC/hpf   Bacteria, UA FEW (A) NONE SEEN   Squamous Epithelial / LPF 11-20 0 - 5   Mucus PRESENT     Comment: Performed at [facility], [address], [place], [place] [address]  Urine rapid drug screen (hosp performed)     Status: None   Collection Time: 11/12/19  3:19 AM  Result Value Ref Range   Opiates NONE DETECTED NONE DETECTED   Cocaine NONE DETECTED NONE DETECTED   Benzodiazepines NONE DETECTED NONE DETECTED   Amphetamines NONE DETECTED NONE DETECTED   Tetrahydrocannabinol NONE DETECTED NONE DETECTED   Barbiturates NONE DETECTED NONE DETECTED    Comment: (NOTE) DRUG SCREEN FOR MEDICAL PURPOSES ONLY.  IF CONFIRMATION IS NEEDED FOR ANY PURPOSE, NOTIFY LAB WITHIN 5  DAYS. LOWEST DETECTABLE LIMITS FOR URINE DRUG SCREEN Drug Class                     Cutoff (ng/mL) Amphetamine and metabolites    1000 Barbiturate and metabolites    200 Benzodiazepine                 200 Tricyclics and metabolites     300 Opiates and metabolites        300 Cocaine and metabolites        300 THC                            50 Performed at [facility], [address], [place], [place] [address]     Review of Systems  Constitutional: Negative for fever.  Gastrointestinal: Positive for abdominal pain. Negative for nausea and vomiting.  Genitourinary: Negative for vaginal  bleeding, vaginal discharge and vaginal pain.   Physical Exam   Blood pressure 109/67, pulse 98, temperature 98.2 F (36.8 C), temperature source Oral, resp. rate 20, height 5\' 11"  (1.803 m), weight 113.4 kg, SpO2 100 %, unknown if currently breastfeeding.  Physical Exam  Constitutional: She is oriented to person, place, and time. She appears well-developed and well-nourished. No distress.  HENT:  Head: Normocephalic.  Eyes: Pupils are equal, round, and reactive to light.  GI: Soft. She exhibits no distension and no mass. There is no abdominal tenderness. There is no rebound and no guarding.  Genitourinary:    Genitourinary Comments: Cervix: closed, thick, posterior, firm No blood or discharge noted on exam glove.   Musculoskeletal:        General: Normal range of motion.  Neurological: She is oriented to person, place, and time.  Skin: Skin is warm. She is not diaphoretic.  Psychiatric: Her behavior is normal. Her mood appears anxious. She expresses impulsivity.   Fetal Tracing: Baseline: 120 bpm Variability: moderate  Accelerations: 15x15 Decelerations: None Toco: UI  MAU Course  Procedures  None  MDM  Patient declines pain medication at this time. LR bolus X 2, urine shows ketones.  Procardia X 1 dose 4:30: patient sleeping, feeling much better.  [place] for discharge  home Cervix firm and closed, no contractions noted on the FM. + fetal movements at this time.   Assessment and Plan   A:  1. Braxton Hicks contractions   2. Preterm contractions   3. [redacted] weeks gestation of pregnancy     P:   Discharge home with strict return precautions Patient improved with procardia and IV fluids Return to MAU if symptoms worsen Preterm labor precautions reviewed.    Lezlie Lye, NP 11/12/2019 11:50 AM

## 2019-11-12 NOTE — MAU Note (Signed)
.   Nichole Kirk is a 39 y.o. at [redacted]w[redacted]d here in MAU reporting: ctx that began yesterday morning around 1000. She reports decreased fetal movement since 1700 yesterday. No VB or LOF.   Pain score: 10 Vitals:   11/12/19 0245 11/12/19 0256  BP: 93/63   Pulse: (!) 113 98  Resp:  20  Temp:  98.2 F (36.8 C)  SpO2:  100%     FHT:133 Lab orders placed from triage:

## 2019-11-30 ENCOUNTER — Other Ambulatory Visit: Payer: Self-pay | Admitting: Obstetrics and Gynecology

## 2019-12-24 ENCOUNTER — Other Ambulatory Visit: Payer: Self-pay

## 2019-12-24 ENCOUNTER — Observation Stay (HOSPITAL_COMMUNITY)
Admission: AD | Admit: 2019-12-24 | Discharge: 2019-12-25 | Disposition: A | Discharge status: Home / Self Care | Payer: Medicaid Other | Attending: Obstetrics | Admitting: Obstetrics

## 2019-12-24 ENCOUNTER — Encounter (HOSPITAL_COMMUNITY): Payer: Self-pay | Admitting: Obstetrics and Gynecology

## 2019-12-24 ENCOUNTER — Inpatient Hospital Stay (HOSPITAL_BASED_OUTPATIENT_CLINIC_OR_DEPARTMENT_OTHER): Payer: Medicaid Other

## 2019-12-24 DIAGNOSIS — O26899 Other specified pregnancy related conditions, unspecified trimester: Secondary | ICD-10-CM

## 2019-12-24 DIAGNOSIS — O99013 Anemia complicating pregnancy, third trimester: Secondary | ICD-10-CM

## 2019-12-24 DIAGNOSIS — O26893 Other specified pregnancy related conditions, third trimester: Secondary | ICD-10-CM | POA: Diagnosis not present

## 2019-12-24 DIAGNOSIS — D649 Anemia, unspecified: Secondary | ICD-10-CM

## 2019-12-24 DIAGNOSIS — R109 Unspecified abdominal pain: Secondary | ICD-10-CM | POA: Diagnosis not present

## 2019-12-24 DIAGNOSIS — O34219 Maternal care for unspecified type scar from previous cesarean delivery: Secondary | ICD-10-CM

## 2019-12-24 DIAGNOSIS — Z3A36 36 weeks gestation of pregnancy: Secondary | ICD-10-CM

## 2019-12-24 DIAGNOSIS — O4703 False labor before 37 completed weeks of gestation, third trimester: Principal | ICD-10-CM | POA: Insufficient documentation

## 2019-12-24 DIAGNOSIS — R42 Dizziness and giddiness: Secondary | ICD-10-CM

## 2019-12-24 DIAGNOSIS — Z3689 Encounter for other specified antenatal screening: Secondary | ICD-10-CM

## 2019-12-24 DIAGNOSIS — Z20822 Contact with and (suspected) exposure to covid-19: Secondary | ICD-10-CM | POA: Diagnosis not present

## 2019-12-24 DIAGNOSIS — R102 Pelvic and perineal pain: Secondary | ICD-10-CM | POA: Diagnosis present

## 2019-12-24 LAB — RAPID URINE DRUG SCREEN, HOSP PERFORMED
Amphetamines: NOT DETECTED
Barbiturates: NOT DETECTED
Benzodiazepines: NOT DETECTED
Cocaine: NOT DETECTED
Opiates: NOT DETECTED
Tetrahydrocannabinol: NOT DETECTED

## 2019-12-24 LAB — COMPREHENSIVE METABOLIC PANEL
ALT: 14 U/L (ref 0–44)
AST: 22 U/L (ref 15–41)
Albumin: 2.4 g/dL — ABNORMAL LOW (ref 3.5–5.0)
Alkaline Phosphatase: 130 U/L — ABNORMAL HIGH (ref 38–126)
Anion gap: 9 (ref 5–15)
BUN: 5 mg/dL — ABNORMAL LOW (ref 6–20)
CO2: 20 mmol/L — ABNORMAL LOW (ref 22–32)
Calcium: 8.8 mg/dL — ABNORMAL LOW (ref 8.9–10.3)
Chloride: 105 mmol/L (ref 98–111)
Creatinine, Ser: 0.57 mg/dL (ref 0.44–1.00)
GFR calc Af Amer: >60 mL/min (ref >60)
GFR calc non Af Amer: >60 mL/min (ref >60)
Glucose, Bld: 101 mg/dL — ABNORMAL HIGH (ref 70–99)
Potassium: 3.9 mmol/L (ref 3.5–5.1)
Sodium: 134 mmol/L — ABNORMAL LOW (ref 135–145)
Total Bilirubin: 0.8 mg/dL (ref 0.3–1.2)
Total Protein: 5.9 g/dL — ABNORMAL LOW (ref 6.5–8.1)

## 2019-12-24 LAB — TYPE AND SCREEN
ABO/RH(D): A POS
Antibody Screen: NEGATIVE

## 2019-12-24 LAB — CBC
HCT: 29.7 % — ABNORMAL LOW (ref 36.0–46.0)
Hemoglobin: 9 g/dL — ABNORMAL LOW (ref 12.0–15.0)
MCH: 24.7 pg — ABNORMAL LOW (ref 26.0–34.0)
MCHC: 30.3 g/dL (ref 30.0–36.0)
MCV: 81.6 fL (ref 80.0–100.0)
Platelets: 213 10*3/uL (ref 150–400)
RBC: 3.64 MIL/uL — ABNORMAL LOW (ref 3.87–5.11)
RDW: 14.8 % (ref 11.5–15.5)
WBC: 9.8 10*3/uL (ref 4.0–10.5)
nRBC: 0 % (ref 0.0–0.2)

## 2019-12-24 LAB — URINALYSIS, COMPLETE (UACMP) WITH MICROSCOPIC
Bilirubin Urine: NEGATIVE
Glucose, UA: NEGATIVE mg/dL
Hgb urine dipstick: NEGATIVE
Ketones, ur: 20 mg/dL — AB
Leukocytes,Ua: NEGATIVE
Nitrite: NEGATIVE
Protein, ur: 30 mg/dL — AB
Specific Gravity, Urine: 1.028 (ref 1.005–1.030)
pH: 5 (ref 5.0–8.0)

## 2019-12-24 LAB — RESPIRATORY PANEL BY RT PCR (FLU A&B, COVID)
Influenza A by PCR: NEGATIVE
Influenza B by PCR: NEGATIVE
SARS Coronavirus 2 by RT PCR: NEGATIVE

## 2019-12-24 LAB — PROTEIN / CREATININE RATIO, URINE
Creatinine, Urine: 228.35 mg/dL
Protein Creatinine Ratio: 0.04 mg/mg{Cre} (ref 0.00–0.15)
Total Protein, Urine: 10 mg/dL

## 2019-12-24 LAB — TSH: TSH: 1.966 u[IU]/mL (ref 0.350–4.500)

## 2019-12-24 LAB — GLUCOSE, CAPILLARY: Glucose-Capillary: 68 mg/dL — ABNORMAL LOW (ref 70–99)

## 2019-12-24 LAB — T4, FREE: Free T4: 0.66 ng/dL (ref 0.61–1.12)

## 2019-12-24 MED ORDER — DEXTROSE IN LACTATED RINGERS 5 % IV SOLN: INTRAVENOUS | Status: DC

## 2019-12-24 MED ORDER — SODIUM CHLORIDE 0.9% FLUSH: 3.0000 mL | Freq: Two times a day (BID) | INTRAVENOUS | Status: DC

## 2019-12-24 MED ORDER — BETAMETHASONE SOD PHOS & ACET 6 (3-3) MG/ML IJ SUSP
12.0000 mg | INTRAMUSCULAR | Status: DC
  Administered 2019-12-24: 12 mg via INTRAMUSCULAR
  Filled 2019-12-24: qty 5

## 2019-12-24 MED ORDER — SODIUM CHLORIDE 0.9% FLUSH: 3.0000 mL | INTRAVENOUS | Status: DC | PRN

## 2019-12-24 MED ORDER — ONDANSETRON 4 MG PO TBDP: 8.0000 mg | ORAL_TABLET | Freq: Three times a day (TID) | ORAL | Status: DC | PRN

## 2019-12-24 MED ORDER — CALCIUM CARBONATE ANTACID 500 MG PO CHEW: 2.0000 | CHEWABLE_TABLET | ORAL | Status: DC | PRN

## 2019-12-24 MED ORDER — DOCUSATE SODIUM 100 MG PO CAPS
100.0000 mg | ORAL_CAPSULE | Freq: Two times a day (BID) | ORAL | Status: DC | PRN
  Administered 2019-12-25: 100 mg via ORAL

## 2019-12-24 MED ORDER — ALBUTEROL SULFATE HFA 108 (90 BASE) MCG/ACT IN AERS
1.0000 | INHALATION_SPRAY | Freq: Four times a day (QID) | RESPIRATORY_TRACT | Status: DC | PRN
  Administered 2019-12-24 – 2019-12-25 (×2): 2 via RESPIRATORY_TRACT
  Filled 2019-12-24: qty 6.7

## 2019-12-24 MED ORDER — SODIUM CHLORIDE 0.9 % IV SOLN
510.0000 mg | Freq: Once | INTRAVENOUS | Status: AC
  Administered 2019-12-24: 22:00:00 510 mg via INTRAVENOUS
  Filled 2019-12-24: qty 17

## 2019-12-24 MED ORDER — PRENATAL MULTIVITAMIN CH: 1.0000 | ORAL_TABLET | Freq: Every day | ORAL | Status: DC

## 2019-12-24 MED ORDER — DOCUSATE SODIUM 100 MG PO CAPS
100.0000 mg | ORAL_CAPSULE | Freq: Every day | ORAL | Status: DC
  Filled 2019-12-24 (×2): qty 1

## 2019-12-24 MED ORDER — ACETAMINOPHEN 325 MG PO TABS
650.0000 mg | ORAL_TABLET | Freq: Four times a day (QID) | ORAL | Status: DC | PRN
  Administered 2019-12-24: 650 mg via ORAL
  Filled 2019-12-24: qty 2

## 2019-12-24 MED ORDER — ZOLPIDEM TARTRATE 5 MG PO TABS: 5.0000 mg | ORAL_TABLET | Freq: Every evening | ORAL | Status: DC | PRN

## 2019-12-24 MED ORDER — PANTOPRAZOLE SODIUM 40 MG PO TBEC
40.0000 mg | DELAYED_RELEASE_TABLET | Freq: Every day | ORAL | Status: DC
  Administered 2019-12-25: 40 mg via ORAL
  Filled 2019-12-24: qty 1

## 2019-12-24 MED ORDER — LACTATED RINGERS IV BOLUS
1000.0000 mL | Freq: Once | INTRAVENOUS | Status: AC
  Administered 2019-12-24: 1000 mL via INTRAVENOUS

## 2019-12-24 MED ORDER — FAMOTIDINE 20 MG PO TABS
20.0000 mg | ORAL_TABLET | Freq: Two times a day (BID) | ORAL | Status: DC
  Administered 2019-12-24 – 2019-12-25 (×2): 20 mg via ORAL
  Filled 2019-12-24 (×2): qty 1

## 2019-12-24 MED ORDER — ALBUTEROL SULFATE HFA 108 (90 BASE) MCG/ACT IN AERS: 1.0000 | INHALATION_SPRAY | Freq: Four times a day (QID) | RESPIRATORY_TRACT | Status: DC | PRN

## 2019-12-24 MED ORDER — FLUTICASONE FUROATE-VILANTEROL 200-25 MCG/INH IN AEPB
1.0000 | INHALATION_SPRAY | Freq: Every day | RESPIRATORY_TRACT | Status: DC
  Filled 2019-12-24: qty 28

## 2019-12-24 MED ORDER — SODIUM CHLORIDE 0.9 % IV SOLN
250.0000 mL | INTRAVENOUS | Status: DC | PRN
  Administered 2019-12-24: 250 mL via INTRAVENOUS

## 2019-12-24 MED ORDER — PRENATAL 27-0.8 MG PO TABS: 1.0000 | ORAL_TABLET | Freq: Every day | ORAL | Status: DC

## 2019-12-24 NOTE — MAU Note (Signed)
[name] is a 39 y.o. at [redacted]w[redacted]d here in MAU reporting: pt arrived via EMS, states she has been having contractions, EMS notes they are about every 5 min. EMS reports that while on the truck all VSS but that the pt had a syncopal episode and her eyes went cross. Per EMS pt is oriented. Pt denies any bleeding or LOF.  Arrived with 18g IV in left AC and has NS bolus infusing.  [place] CNM to bedside when pt arrived on the unit. FHT dopplered at 147 and [place] CNM did VE- pt is 0.5 cm.   Onset of complaint: today  Pain score: 10/10  Vitals:   12/24/19 1854  BP: 102/66  Pulse: 92  Resp: 20  Temp: 98.4 F (36.9 C)  SpO2: 100%     FHT: 147  Lab orders placed from triage: Crisoforo Oxford CNM will enter orders

## 2019-12-24 NOTE — MAU Provider Note (Signed)
Patient Nichole Kirk is 39 y.o. [phone] At [redacted]w[redacted]d here with complaints of abdominal pain that started at 1800. She was eating dinner at [employer] with her friend when she developed a sharp pain in her right side. She and her friend got worried so they called EMS.   Per EMS report, patient had a syncopal episode on the truck where she became non-responsive and her eyes crossed.   She denies vaginal bleeding, LOF, decreased fetal movements. She endorses a headache all day, blurry vision in the ambulance (spinning), nauseated.   History     CSN: [id number]  Arrival date and time: 12/24/19 1846   None     Chief Complaint  Patient presents with  . Contractions  . Loss of Consciousness   Abdominal Pain This is a new problem. The current episode started yesterday. The onset quality is sudden. The problem occurs constantly. The pain is at a severity of 10/10. The quality of the pain is sharp. The abdominal pain does not radiate. Pertinent negatives include no constipation, diarrhea or dysuria. Nothing aggravates the pain. The pain is relieved by nothing.  The pain is located in the right lower quadrant.  She also endorses continuous blurry vision that started in the EMS truck.    OB History    Gravida  7   Para  2   Term  2   Preterm  0   AB  4   Living  3     SAB  2   TAB  2   Ectopic  0   Multiple  1   Live Births  2           Past Medical History:  Diagnosis Date  . Abnormal Pap smear   . Anxiety 2012   no meds   . Asthma    albuterol inhaler  . Gonorrhea 2007  . Ovarian cyst   . Urinary tract infection     Past Surgical History:  Procedure Laterality Date  . CESAREAN SECTION  2010  . CESAREAN SECTION  07/29/2011   Procedure: CESAREAN SECTION;  Surgeon: Marcial Pacas;  Location: [facility];  Service: Gynecology;  Laterality: N/A;  Repeat  . CHOLECYSTECTOMY    . DILATION AND CURETTAGE OF UTERUS    . DILATION AND EVACUATION N/A 11/22/2017   Procedure: DILATATION AND EVACUATION;  Surgeon: Olga Millers, MD;  Location: [facility];  Service: Gynecology;  Laterality: N/A;  . DILATION AND EVACUATION N/A 11/08/2018   Procedure: DILATATION AND EVACUATION;  Surgeon: Osborne Oman, MD;  Location: [facility];  Service: Gynecology;  Laterality: N/A;  . mab     x 1 - no surgery required 2009  . WISDOM TOOTH EXTRACTION      Family History  Problem Relation Age of Onset  . Cancer Mother        Breast  . Diabetes Mother   . Breast cancer Mother   . Diabetes Maternal Grandfather   . Cancer Maternal Grandmother        Breast  . Lupus Maternal Grandmother   . Breast cancer Maternal Grandmother   . Cancer Father   . Multiple sclerosis Sister   . Cancer Paternal Aunt        breast  . Cancer Paternal Aunt        breast  . Cancer Paternal Aunt        ovarian    Social History   Tobacco Use  . Smoking  status: Never Smoker  . Smokeless tobacco: Never Used  Substance Use Topics  . Alcohol use: No    Alcohol/week: 1.0 - 3.0 standard drinks    Types: 1 - 3 Cans of beer per week    Comment: socially but none with pregnancy  . Drug use: No    Allergies:  Allergies  Allergen Reactions  . Aspirin Anaphylaxis    Pt says she can take ibuprofen  . Mushroom Extract Complex Swelling and Rash    Medications Prior to Admission  Medication Sig Dispense Refill Last Dose  . acetaminophen (TYLENOL) 325 MG tablet Take 650 mg by mouth every 6 (six) hours as needed.     Marland Kitchen albuterol (PROVENTIL HFA;VENTOLIN HFA) 108 (90 Base) MCG/ACT inhaler Inhale 1-2 puffs into the lungs every 6 (six) hours as needed for wheezing or shortness of breath.     . budesonide-formoterol (SYMBICORT) 160-4.5 MCG/ACT inhaler Inhale 2 puffs into the lungs 2 (two) times daily. 1 Inhaler 11   . budesonide-formoterol (SYMBICORT) 80-4.5 MCG/ACT inhaler Inhale into the lungs.     . docusate sodium (COLACE) 100 MG capsule Take 1 capsule (100 mg total) by  mouth 2 (two) times daily as needed for mild constipation or moderate constipation. (Patient not taking: Reported on 11/30/2018) 30 capsule 2   . esomeprazole (NEXIUM) 20 MG capsule Take 1 capsule (20 mg total) by mouth daily. 14 capsule 0   . ondansetron (ZOFRAN ODT) 8 MG disintegrating tablet Take 1 tablet (8 mg total) by mouth every 8 (eight) hours as needed for nausea or vomiting. (Patient not taking: Reported on 11/30/2018) 20 tablet 0   . Prenatal Vit-Fe Fumarate-FA (MULTIVITAMIN-PRENATAL) 27-0.8 MG TABS tablet Take 1 tablet by mouth daily at 12 noon.       Review of Systems  Constitutional: Negative.   Gastrointestinal: Positive for abdominal pain. Negative for constipation and diarrhea.  Genitourinary: Negative for dysuria.   Physical Exam   Blood pressure 102/66, pulse 92, temperature 98.4 F (36.9 C), temperature source Oral, resp. rate 20, SpO2 100 %, unknown if currently breastfeeding.  Physical Exam  Constitutional: She is oriented to person, place, and time. She appears well-developed and well-nourished.  HENT:  Head: Normocephalic.  Respiratory: Effort normal.  GI: Soft.  Musculoskeletal:        General: No tenderness or edema. Normal range of motion.     Cervical back: Normal range of motion.     Comments: Arm and leg strength equal bilaterally.  Neurological: She is alert and oriented to person, place, and time.  Skin: Skin is warm and dry.    MAU Course  Procedures  MDM -CNM at the bedside to assess patient; FHT dopplered at 147, cervical exam shows patient is long, close and thick. Abdomen is soft, no vaginal bleeding, no areas of tenderness on abdomen.  VSS; O2 sat at 100 percent. Afebrile. Patient is continuosly starting into space and is slow to respond.   Blood glucose is 68 so patient was offered a few sips of soda.  UDS ordered; CBC, CMP, PCR, ABO, RPR ordered.  Assessment and Plan  Consult with Dr. Alysia Penna, who recommends admission and observation. Called  Dr. [place] Cloud, who will come to the bedside.   Charlesetta Garibaldi Daesha Insco 12/24/2019, 7:12 PM

## 2019-12-24 NOTE — H&P (Signed)
39 y.o. [redacted]w[redacted]d  [id number] comes in c/o contractions.  While being transported by EMS she had a syncopal episode.  Pt had complained at last visit of dizziness and headache.  Pt had been suggested to go to MAU then but did not.    Today, she had not eaten in a while and was at [employer] about to eat when vaginal pain and pressure started.  They called EMS and was transported.  EMS report given to NP is that she fainted while in ambulance and was slow to wake.  They did not describe a seizure.  When patient arrived at MAU she was alternating sleepy and writhing around.  I was called to evaluate.  While pt was getting her EKG she complained of shortness of breath while lying flat but her SAO2 was 100% on room air.  She looked like she was in pain at points but did not describe abdominal pain as much as vaginal pain.  She c/o a headache and blurry vision- her friend states earlier she had double vision.  She does not complain of RUQ pain or scotomata.    Otherwise has good fetal movement and no bleeding.  She denies bleeding from anywhere.   Past Medical History:  Diagnosis Date  . Abnormal Pap smear   . Anxiety 2012   no meds   . Asthma    albuterol inhaler  . Gonorrhea 2007  . Ovarian cyst   . Urinary tract infection     Past Surgical History:  Procedure Laterality Date  . CESAREAN SECTION  2010  . CESAREAN SECTION  07/29/2011   Procedure: CESAREAN SECTION;  Surgeon: Almon Hercules;  Location: [facility];  Service: Gynecology;  Laterality: N/A;  Repeat  . CHOLECYSTECTOMY    . DILATION AND CURETTAGE OF UTERUS    . DILATION AND EVACUATION N/A 11/22/2017   Procedure: DILATATION AND EVACUATION;  Surgeon: Levi Aland, MD;  Location: [facility];  Service: Gynecology;  Laterality: N/A;  . DILATION AND EVACUATION N/A 11/08/2018   Procedure: DILATATION AND EVACUATION;  Surgeon: Tereso Newcomer, MD;  Location: [facility];  Service: Gynecology;  Laterality: N/A;  . mab     x 1 - no surgery  required 2009  . WISDOM TOOTH EXTRACTION      OB History  Gravida Para Term Preterm AB Living  7 2 2  0 4 3  SAB TAB Ectopic Multiple Live Births  2 2 0 1 2    # Outcome Date GA Lbr Len/2nd Weight Sex Delivery Anes PTL Lv  7 Current           6 TAB 2020          5 SAB 2019          4 Term 07/29/11 [redacted]w[redacted]d  3737 g M CS-LTranv Spinal  LIV  3 Term 09/05/09    M CS-LTranv Spinal N LIV     Birth Comments: twins  2 SAB           1 TAB             Social History   Socioeconomic History  . Marital status: Significant Other    Spouse name: Not on file  . Number of children: Not on file  . Years of education: Not on file  . Highest education level: Not on file  Occupational History  . Not on file  Tobacco Use  . Smoking status: Never Smoker  . Smokeless tobacco:  Never Used  Substance and Sexual Activity  . Alcohol use: No    Alcohol/week: 1.0 - 3.0 standard drinks    Types: 1 - 3 Cans of beer per week    Comment: socially but none with pregnancy  . Drug use: No  . Sexual activity: Yes  Other Topics Concern  . Not on file  Social History Narrative  . Not on file   Social Determinants of Health   Financial Resource Strain:   . Difficulty of Paying Living Expenses:   Food Insecurity:   . Worried About Charity fundraiser in the Last Year:   . Arboriculturist in the Last Year:   Transportation Needs:   . Film/video editor (Medical):   Marland Kitchen Lack of Transportation (Non-Medical):   Physical Activity:   . Days of Exercise per Week:   . Minutes of Exercise per Session:   Stress:   . Feeling of Stress :   Social Connections:   . Frequency of Communication with Friends and Family:   . Frequency of Social Gatherings with Friends and Family:   . Attends Religious Services:   . Active Member of Clubs or Organizations:   . Attends Archivist Meetings:   Marland Kitchen Marital Status:   Intimate Partner Violence:   . Fear of Current or Ex-Partner:   . Emotionally Abused:   Marland Kitchen  Physically Abused:   . Sexually Abused:    Aspirin and Mushroom extract complex    Prenatal Transfer Tool  Maternal Diabetes: No Genetic Screening: Normal Maternal Ultrasounds/Referrals: Normal Fetal Ultrasounds or other Referrals:  None Maternal Substance Abuse:  No Significant Maternal Medications:  Meds include: Nexium Other: asthma meds Significant Maternal Lab Results: None  Other PNC: Otherwise uncomplicated.    Vitals:   12/24/19 1854  BP: 102/66  Pulse: 92  Resp: 20  Temp: 98.4 F (36.9 C)  TempSrc: Oral  SpO2: 100%   Results for orders placed or performed during the hospital encounter of 12/24/19 (from the past 24 hour(s))  Glucose, capillary     Status: Abnormal   Collection Time: 12/24/19  6:55 PM  Result Value Ref Range   Glucose-Capillary 68 (L) 70 - 99 mg/dL  CBC     Status: Abnormal   Collection Time: 12/24/19  7:24 PM  Result Value Ref Range   WBC 9.8 4.0 - 10.5 K/uL   RBC 3.64 (L) 3.87 - 5.11 MIL/uL   Hemoglobin 9.0 (L) 12.0 - 15.0 g/dL   HCT 29.7 (L) 36.0 - 46.0 %   MCV 81.6 80.0 - 100.0 fL   MCH 24.7 (L) 26.0 - 34.0 pg   MCHC 30.3 30.0 - 36.0 g/dL   RDW 14.8 11.5 - 15.5 %   Platelets 213 150 - 400 K/uL   nRBC 0.0 0.0 - 0.2 %  Comprehensive metabolic panel     Status: Abnormal   Collection Time: 12/24/19  7:24 PM  Result Value Ref Range   Sodium 134 (L) 135 - 145 mmol/L   Potassium 3.9 3.5 - 5.1 mmol/L   Chloride 105 98 - 111 mmol/L   CO2 20 (L) 22 - 32 mmol/L   Glucose, Bld 101 (H) 70 - 99 mg/dL   BUN 5 (L) 6 - 20 mg/dL   Creatinine, Ser 0.57 0.44 - 1.00 mg/dL   Calcium 8.8 (L) 8.9 - 10.3 mg/dL   Total Protein 5.9 (L) 6.5 - 8.1 g/dL   Albumin 2.4 (L) 3.5 - 5.0 g/dL  AST 22 15 - 41 U/L   ALT 14 0 - 44 U/L   Alkaline Phosphatase 130 (H) 38 - 126 U/L   Total Bilirubin 0.8 0.3 - 1.2 mg/dL   GFR calc non Af Amer >60 >60 mL/min   GFR calc Af Amer >60 >60 mL/min   Anion gap 9 5 - 15  Type and screen [facility]      Status: None (Preliminary result)   Collection Time: 12/24/19  7:24 PM  Result Value Ref Range   ABO/RH(D) PENDING    Antibody Screen PENDING    Sample Expiration      12/27/2019,2359 Performed at [facility], [address], [place], [place] [address]      Lungs/Cor:  NAD Abdomen:  soft, gravid Ex:  no cords, erythema SVE:  C//th/high per nurse FHTs:  130s, good STV, NST R; Cat 1 tracing. Toco:  q irreg   A/P   89 y.o. [id number] [redacted]w[redacted]d with vaginal pain, fainting, headache and blurry vision.  Pt has no signs however of GHTN or preeclampsia.   GBS - will do today. 1.  History of dizziness and tonight fainting.  EKG is normal sinus rhythym.  Pulse is slightly elevated.  Pt is anemic and will do iron infusion while she is here.  Last h/h was 10/30.  Pt has no signs of bleeding anywhere.   2.  Abdominal pain- pt states feels like contractions but none on Toco.  Will get [place] for eval of baby, BPP, AFI and also if they can see the former uterine scar.  3.  BMZ in case of early delivery.   4.  Covid testing pending.   Loney Laurence

## 2019-12-25 ENCOUNTER — Other Ambulatory Visit: Payer: Self-pay | Admitting: Obstetrics

## 2019-12-25 DIAGNOSIS — O26893 Other specified pregnancy related conditions, third trimester: Secondary | ICD-10-CM | POA: Diagnosis present

## 2019-12-25 LAB — ABO/RH: ABO/RH(D): A POS

## 2019-12-25 LAB — RPR: RPR Ser Ql: NONREACTIVE

## 2019-12-25 MED ORDER — BETAMETHASONE SOD PHOS & ACET 6 (3-3) MG/ML IJ SUSP: 12.0000 mg | Freq: Once | INTRAMUSCULAR | Status: DC

## 2019-12-25 NOTE — Discharge Summary (Signed)
Obstetric Discharge Summary  [id number]  presented to MAU last night with sudden onset pelvic pressure and pain.  She reports a busy weekend moving.  Yesterday, she ate pop tarts for breakfast and then slept the rest of the day without any additional meals.  She was at a restaurant yesterday evening waiting for dinner when she had significant pelvic pain.   She was worried about labor so EMS was called.  She had a syncopal episode while in the ambulance.  There was no seizure like activity  She had a normal EKG in MAU.  Her vital signs were normal and her labs were normal with the exception of anemia.  Given her history of pelvic pain and prior cesarean section x 2, she was admitted for observation.  On antepartum, she received an iron transfusion.  She received BMZ.  She was placed on continuous monitoring with rare contractions  On HD#1, her cervix was unchanged at fingertip/long/high.  Her vital signs remained normal.  There was no s/sx of preterm labor.  Her main symptoms was significant pelvic pressure with walking.  Her syncopal episode was felt to be related to not eating all day prior to presentation.  Hemoglobin  Date Value Ref Range Status  12/24/2019 9.0 (L) 12.0 - 15.0 g/dL Final   HCT  Date Value Ref Range Status  12/24/2019 29.7 (L) 36.0 - 46.0 % Final    Physical Exam:  General: alert, cooperative and appears stated age  Uterus:  Soft, gravid, nontender SVE: Ft/long/high DVT Evaluation: trace lower extremity edema bilaterally  Discharge Diagnoses: Preterm contractions without labor  Discharge Information: Date: 12/25/2019 Activity: unrestricted Diet: routine Medications: PNV and Iron Condition: stable Instructions: refer to practice specific booklet Discharge to: home   She will return on 3/23 to MAU for second dose of BMZ Follow-up Information    Ob/Gyn, Nestor Ramp Follow up on 12/29/2019.   Why: routine ob visit reschedule Contact information: [address] [place] [address] [phone]             [facility] GEFFEL Chestine Spore 12/25/2019, 10:49 AM

## 2019-12-25 NOTE — Progress Notes (Signed)
Discharge instructions given to patient.  Reviewed fetal kick count and when to notify provider.  Discussed follow up appointment time.  Patient to return to MAU tomorrow am for BMZ injection.  Patient verbalized understanding.

## 2019-12-25 NOTE — Progress Notes (Signed)
Patient seen and examined.  Presented to MAU last night with sudden onset pelvic pressure and pain.  She reports a busy weekend moving.  Yesterday, she ate pop tarts for breakfast and then slept the rest of the day without any additional meals.  She was at a restaurant yesterday evening waiting for dinner when she had significant pelvic pain.   She was worried about labor so EMS was called.  She had a syncopal episode while in the ambulance.  There was no seizure like activity.    In MAU, she had a normal EKG with sinus tach.   BPs have been at her baseline--high 90s-110s/60s.  She has not had any other episodes of feeling dizzy.  Fetal monitoring overnight has been normal with rare 1-2 contractions/hour and reactive tracing.  She was found to be anemic so receive IV iron infusion overnight.  She was started on betamethosone series in event of needed delivery.   On my exam, she is resting comfortably in bed eating. She denies contractions or bleeding.  Endorse FM.  Pain is only pelvic pressure with moving.    NAD Abd soft, non-tender, gravid SVE: FT/long/soft  A/P:  Z5A0 @ 36w2 with MSK pain of pregnancy Will discharge to home Likely hypoglycemic episode yesterday --discussed importance of dietary choices Will return to MAU tomorrow for second dose of BMZ Labor precautions reviewed

## 2019-12-27 LAB — CULTURE, BETA STREP (GROUP B ONLY)

## 2020-01-02 ENCOUNTER — Encounter (HOSPITAL_COMMUNITY): Payer: Self-pay

## 2020-01-02 NOTE — Patient Instructions (Addendum)
Nichole Kirk  01/02/2020   Your procedure is scheduled on:  01/16/2020  Arrive at 1000 at Entrance C on [address] at [facility]  and [employer]. You are invited to use the FREE valet parking or use the Visitor's parking deck.  Pick up the phone at the desk and dial [phone].  Call this number if you have problems the morning of surgery: [phone]  Remember:   Do not eat food:(After Midnight) Desps de medianoche.  Do not drink clear liquids: (After Midnight) Desps de medianoche.  Take these medicines the morning of surgery with A SIP OF WATER:  Take your symbicort and please bring your inhaler. May take your nexium if you wish.   Do not wear jewelry, make-up or nail polish.  Do not wear lotions, powders, or perfumes. Do not wear deodorant.  Do not shave 48 hours prior to surgery.  Do not bring valuables to the hospital.  [facility] is not   responsible for any belongings or valuables brought to the hospital.  Contacts, dentures or bridgework may not be worn into surgery.  Leave suitcase in the car. After surgery it may be brought to your room.  For patients admitted to the hospital, checkout time is 11:00 AM the day of              discharge.      Please read over the following fact sheets that you were given:     Preparing for Surgery

## 2020-01-04 ENCOUNTER — Encounter (HOSPITAL_COMMUNITY): Payer: Self-pay

## 2020-01-09 ENCOUNTER — Encounter (HOSPITAL_COMMUNITY): Payer: Self-pay

## 2020-01-14 ENCOUNTER — Other Ambulatory Visit (HOSPITAL_COMMUNITY)
Admission: RE | Admit: 2020-01-14 | Discharge: 2020-01-14 | Disposition: A | Discharge status: Home / Self Care | Payer: Medicaid Other | Source: Ambulatory Visit | Attending: Obstetrics | Admitting: Obstetrics

## 2020-01-14 ENCOUNTER — Other Ambulatory Visit: Payer: Self-pay

## 2020-01-14 DIAGNOSIS — Z01812 Encounter for preprocedural laboratory examination: Secondary | ICD-10-CM | POA: Diagnosis present

## 2020-01-14 DIAGNOSIS — Z20822 Contact with and (suspected) exposure to covid-19: Secondary | ICD-10-CM | POA: Insufficient documentation

## 2020-01-14 HISTORY — DX: Anemia, unspecified: D64.9

## 2020-01-14 LAB — CBC WITH DIFFERENTIAL/PLATELET
Abs Immature Granulocytes: 0.04 10*3/uL (ref 0.00–0.07)
Basophils Absolute: 0 10*3/uL (ref 0.0–0.1)
Basophils Relative: 0 %
Eosinophils Absolute: 0.1 10*3/uL (ref 0.0–0.5)
Eosinophils Relative: 1 %
HCT: 36.9 % (ref 36.0–46.0)
Hemoglobin: 11.3 g/dL — ABNORMAL LOW (ref 12.0–15.0)
Immature Granulocytes: 1 %
Lymphocytes Relative: 19 %
Lymphs Abs: 1.7 10*3/uL (ref 0.7–4.0)
MCH: 25.9 pg — ABNORMAL LOW (ref 26.0–34.0)
MCHC: 30.6 g/dL (ref 30.0–36.0)
MCV: 84.4 fL (ref 80.0–100.0)
Monocytes Absolute: 0.8 10*3/uL (ref 0.1–1.0)
Monocytes Relative: 10 %
Neutro Abs: 6.1 10*3/uL (ref 1.7–7.7)
Neutrophils Relative %: 69 %
Platelets: 228 10*3/uL (ref 150–400)
RBC: 4.37 MIL/uL (ref 3.87–5.11)
RDW: 19.5 % — ABNORMAL HIGH (ref 11.5–15.5)
WBC: 8.7 10*3/uL (ref 4.0–10.5)
nRBC: 0 % (ref 0.0–0.2)

## 2020-01-14 LAB — TYPE AND SCREEN
ABO/RH(D): A POS
Antibody Screen: NEGATIVE

## 2020-01-14 LAB — SARS CORONAVIRUS 2 (TAT 6-24 HRS): SARS Coronavirus 2: NEGATIVE

## 2020-01-14 LAB — RPR: RPR Ser Ql: NONREACTIVE

## 2020-01-14 NOTE — MAU Note (Signed)
Pt here for PAT covid swab and lab draw. Denies symptoms and sick contacts. Swab collected. 

## 2020-01-16 ENCOUNTER — Inpatient Hospital Stay (HOSPITAL_COMMUNITY): Payer: Medicaid Other

## 2020-01-16 ENCOUNTER — Other Ambulatory Visit: Payer: Self-pay

## 2020-01-16 ENCOUNTER — Encounter (HOSPITAL_COMMUNITY): Payer: Self-pay | Admitting: Obstetrics and Gynecology

## 2020-01-16 ENCOUNTER — Encounter (HOSPITAL_COMMUNITY)
Admission: RE | Disposition: A | Discharge status: Home / Self Care | Payer: Self-pay | Source: Home / Self Care | Attending: Obstetrics and Gynecology

## 2020-01-16 ENCOUNTER — Inpatient Hospital Stay (HOSPITAL_COMMUNITY)
Admission: RE | Admit: 2020-01-16 | Discharge: 2020-01-18 | DRG: 785 | Disposition: A | Discharge status: Home / Self Care | Payer: Medicaid Other | Attending: Obstetrics and Gynecology | Admitting: Obstetrics and Gynecology

## 2020-01-16 DIAGNOSIS — J45909 Unspecified asthma, uncomplicated: Secondary | ICD-10-CM | POA: Diagnosis present

## 2020-01-16 DIAGNOSIS — Z302 Encounter for sterilization: Secondary | ICD-10-CM

## 2020-01-16 DIAGNOSIS — O34211 Maternal care for low transverse scar from previous cesarean delivery: Principal | ICD-10-CM | POA: Diagnosis present

## 2020-01-16 DIAGNOSIS — O99214 Obesity complicating childbirth: Secondary | ICD-10-CM | POA: Diagnosis present

## 2020-01-16 DIAGNOSIS — E669 Obesity, unspecified: Secondary | ICD-10-CM | POA: Diagnosis present

## 2020-01-16 DIAGNOSIS — O9952 Diseases of the respiratory system complicating childbirth: Secondary | ICD-10-CM | POA: Diagnosis present

## 2020-01-16 DIAGNOSIS — O9902 Anemia complicating childbirth: Secondary | ICD-10-CM | POA: Diagnosis present

## 2020-01-16 DIAGNOSIS — Z3A39 39 weeks gestation of pregnancy: Secondary | ICD-10-CM | POA: Diagnosis not present

## 2020-01-16 DIAGNOSIS — D649 Anemia, unspecified: Secondary | ICD-10-CM | POA: Diagnosis present

## 2020-01-16 DIAGNOSIS — Z3490 Encounter for supervision of normal pregnancy, unspecified, unspecified trimester: Secondary | ICD-10-CM

## 2020-01-16 SURGERY — Surgical Case
Anesthesia: Spinal | Laterality: Bilateral | Wound class: Clean Contaminated

## 2020-01-16 MED ORDER — METOCLOPRAMIDE HCL 5 MG/ML IJ SOLN
INTRAMUSCULAR | Status: DC | PRN
  Administered 2020-01-16: 10 mg via INTRAVENOUS

## 2020-01-16 MED ORDER — MEASLES, MUMPS & RUBELLA VAC IJ SOLR: 0.5000 mL | Freq: Once | INTRAMUSCULAR | Status: DC

## 2020-01-16 MED ORDER — NALBUPHINE HCL 10 MG/ML IJ SOLN: 5.0000 mg | INTRAMUSCULAR | Status: DC | PRN

## 2020-01-16 MED ORDER — FENTANYL CITRATE (PF) 100 MCG/2ML IJ SOLN: 25.0000 ug | INTRAMUSCULAR | Status: DC | PRN

## 2020-01-16 MED ORDER — SOD CITRATE-CITRIC ACID 500-334 MG/5ML PO SOLN
30.0000 mL | Freq: Once | ORAL | Status: AC
  Administered 2020-01-16: 30 mL via ORAL

## 2020-01-16 MED ORDER — DIPHENHYDRAMINE HCL 25 MG PO CAPS: 25.0000 mg | ORAL_CAPSULE | ORAL | Status: DC | PRN

## 2020-01-16 MED ORDER — PRENATAL MULTIVITAMIN CH
1.0000 | ORAL_TABLET | Freq: Every day | ORAL | Status: DC
  Administered 2020-01-17: 1 via ORAL
  Filled 2020-01-16: qty 1

## 2020-01-16 MED ORDER — OXYCODONE-ACETAMINOPHEN 5-325 MG PO TABS
1.0000 | ORAL_TABLET | ORAL | Status: DC | PRN
  Administered 2020-01-17: 2 via ORAL
  Administered 2020-01-17 (×2): 1 via ORAL
  Administered 2020-01-17 (×2): 2 via ORAL
  Filled 2020-01-16: qty 1
  Filled 2020-01-16: qty 2
  Filled 2020-01-16: qty 1
  Filled 2020-01-16 (×2): qty 2

## 2020-01-16 MED ORDER — TETANUS-DIPHTH-ACELL PERTUSSIS 5-2.5-18.5 LF-MCG/0.5 IM SUSP: 0.5000 mL | Freq: Once | INTRAMUSCULAR | Status: DC

## 2020-01-16 MED ORDER — ALBUTEROL SULFATE (2.5 MG/3ML) 0.083% IN NEBU: 3.0000 mL | INHALATION_SOLUTION | Freq: Four times a day (QID) | RESPIRATORY_TRACT | Status: DC | PRN

## 2020-01-16 MED ORDER — LACTATED RINGERS IV SOLN: INTRAVENOUS | Status: DC

## 2020-01-16 MED ORDER — COCONUT OIL OIL: 1.0000 "application " | TOPICAL_OIL | Status: DC | PRN

## 2020-01-16 MED ORDER — CEFAZOLIN SODIUM-DEXTROSE 2-4 GM/100ML-% IV SOLN
INTRAVENOUS | Status: AC
  Filled 2020-01-16: qty 100

## 2020-01-16 MED ORDER — OXYTOCIN 40 UNITS IN NORMAL SALINE INFUSION - SIMPLE MED
INTRAVENOUS | Status: DC | PRN
  Administered 2020-01-16: 40 [IU] via INTRAVENOUS

## 2020-01-16 MED ORDER — MEPERIDINE HCL 25 MG/ML IJ SOLN: 6.2500 mg | INTRAMUSCULAR | Status: DC | PRN

## 2020-01-16 MED ORDER — NALOXONE HCL 4 MG/10ML IJ SOLN
1.0000 ug/kg/h | INTRAVENOUS | Status: DC | PRN
  Filled 2020-01-16: qty 5

## 2020-01-16 MED ORDER — NALBUPHINE HCL 10 MG/ML IJ SOLN: 5.0000 mg | Freq: Once | INTRAMUSCULAR | Status: DC | PRN

## 2020-01-16 MED ORDER — METOCLOPRAMIDE HCL 5 MG/ML IJ SOLN
INTRAMUSCULAR | Status: AC
  Filled 2020-01-16: qty 2

## 2020-01-16 MED ORDER — BUPIVACAINE IN DEXTROSE 0.75-8.25 % IT SOLN
INTRATHECAL | Status: DC | PRN
  Administered 2020-01-16: 1.6 mL via INTRATHECAL

## 2020-01-16 MED ORDER — PHENYLEPHRINE HCL-NACL 20-0.9 MG/250ML-% IV SOLN
INTRAVENOUS | Status: DC | PRN
  Administered 2020-01-16: 60 ug/min via INTRAVENOUS

## 2020-01-16 MED ORDER — SOD CITRATE-CITRIC ACID 500-334 MG/5ML PO SOLN
ORAL | Status: AC
  Filled 2020-01-16: qty 30

## 2020-01-16 MED ORDER — FENTANYL CITRATE (PF) 100 MCG/2ML IJ SOLN
INTRAMUSCULAR | Status: AC
  Filled 2020-01-16: qty 2

## 2020-01-16 MED ORDER — OXYTOCIN 40 UNITS IN NORMAL SALINE INFUSION - SIMPLE MED: 2.5000 [IU]/h | INTRAVENOUS | Status: AC

## 2020-01-16 MED ORDER — PHENYLEPHRINE HCL-NACL 20-0.9 MG/250ML-% IV SOLN
INTRAVENOUS | Status: AC
  Filled 2020-01-16: qty 250

## 2020-01-16 MED ORDER — DEXAMETHASONE SODIUM PHOSPHATE 10 MG/ML IJ SOLN
INTRAMUSCULAR | Status: AC
  Filled 2020-01-16: qty 1

## 2020-01-16 MED ORDER — CEFAZOLIN SODIUM-DEXTROSE 2-4 GM/100ML-% IV SOLN
2.0000 g | INTRAVENOUS | Status: AC
  Administered 2020-01-16: 3 g via INTRAVENOUS

## 2020-01-16 MED ORDER — DEXAMETHASONE SODIUM PHOSPHATE 10 MG/ML IJ SOLN
INTRAMUSCULAR | Status: DC | PRN
  Administered 2020-01-16: 10 mg via INTRAVENOUS

## 2020-01-16 MED ORDER — SIMETHICONE 80 MG PO CHEW
80.0000 mg | CHEWABLE_TABLET | ORAL | Status: DC
  Administered 2020-01-16 – 2020-01-17 (×2): 80 mg via ORAL
  Filled 2020-01-16 (×2): qty 1

## 2020-01-16 MED ORDER — SCOPOLAMINE 1 MG/3DAYS TD PT72
1.0000 | MEDICATED_PATCH | TRANSDERMAL | Status: DC
  Administered 2020-01-16: 1.5 mg via TRANSDERMAL
  Filled 2020-01-16: qty 1

## 2020-01-16 MED ORDER — ONDANSETRON HCL 4 MG/2ML IJ SOLN
INTRAMUSCULAR | Status: DC | PRN
  Administered 2020-01-16: 4 mg via INTRAVENOUS

## 2020-01-16 MED ORDER — SODIUM CHLORIDE 0.9% FLUSH: 3.0000 mL | INTRAVENOUS | Status: DC | PRN

## 2020-01-16 MED ORDER — NALOXONE HCL 0.4 MG/ML IJ SOLN: 0.4000 mg | INTRAMUSCULAR | Status: DC | PRN

## 2020-01-16 MED ORDER — DIBUCAINE (PERIANAL) 1 % EX OINT: 1.0000 "application " | TOPICAL_OINTMENT | CUTANEOUS | Status: DC | PRN

## 2020-01-16 MED ORDER — ONDANSETRON HCL 4 MG/2ML IJ SOLN
4.0000 mg | Freq: Three times a day (TID) | INTRAMUSCULAR | Status: DC | PRN
  Administered 2020-01-16: 4 mg via INTRAVENOUS
  Filled 2020-01-16: qty 2

## 2020-01-16 MED ORDER — SODIUM CHLORIDE 0.9 % IV SOLN: INTRAVENOUS | Status: DC | PRN

## 2020-01-16 MED ORDER — WITCH HAZEL-GLYCERIN EX PADS: 1.0000 "application " | MEDICATED_PAD | CUTANEOUS | Status: DC | PRN

## 2020-01-16 MED ORDER — IBUPROFEN 600 MG PO TABS
600.0000 mg | ORAL_TABLET | Freq: Four times a day (QID) | ORAL | Status: DC | PRN
  Administered 2020-01-16 – 2020-01-18 (×4): 600 mg via ORAL
  Filled 2020-01-16 (×4): qty 1

## 2020-01-16 MED ORDER — OXYTOCIN 40 UNITS IN NORMAL SALINE INFUSION - SIMPLE MED
INTRAVENOUS | Status: AC
  Filled 2020-01-16: qty 1000

## 2020-01-16 MED ORDER — MORPHINE SULFATE (PF) 0.5 MG/ML IJ SOLN
INTRAMUSCULAR | Status: AC
  Filled 2020-01-16: qty 10

## 2020-01-16 MED ORDER — SENNOSIDES-DOCUSATE SODIUM 8.6-50 MG PO TABS
2.0000 | ORAL_TABLET | ORAL | Status: DC
  Administered 2020-01-16 – 2020-01-17 (×2): 2 via ORAL
  Filled 2020-01-16 (×2): qty 2

## 2020-01-16 MED ORDER — ZOLPIDEM TARTRATE 5 MG PO TABS: 5.0000 mg | ORAL_TABLET | Freq: Every evening | ORAL | Status: DC | PRN

## 2020-01-16 MED ORDER — FENTANYL CITRATE (PF) 100 MCG/2ML IJ SOLN
INTRAMUSCULAR | Status: DC | PRN
  Administered 2020-01-16: 15 ug via INTRAVENOUS

## 2020-01-16 MED ORDER — SODIUM CHLORIDE 0.9 % IR SOLN
Status: DC | PRN
  Administered 2020-01-16: 1000 mL

## 2020-01-16 MED ORDER — DIPHENHYDRAMINE HCL 50 MG/ML IJ SOLN: 12.5000 mg | INTRAMUSCULAR | Status: DC | PRN

## 2020-01-16 MED ORDER — STERILE WATER FOR IRRIGATION IR SOLN
Status: DC | PRN
  Administered 2020-01-16: 1000 mL

## 2020-01-16 MED ORDER — MORPHINE SULFATE (PF) 0.5 MG/ML IJ SOLN
INTRAMUSCULAR | Status: DC | PRN
  Administered 2020-01-16: .15 mg via INTRATHECAL

## 2020-01-16 SURGICAL SUPPLY — 33 items
APL SKNCLS STERI-STRIP NONHPOA (GAUZE/BANDAGES/DRESSINGS) ×1
BENZOIN TINCTURE PRP APPL 2/3 (GAUZE/BANDAGES/DRESSINGS) ×1 IMPLANT
CLAMP CORD UMBIL (MISCELLANEOUS) IMPLANT
CLIP FILSHIE TUBAL LIGA STRL (Clip) ×2 IMPLANT
CLOTH BEACON ORANGE TIMEOUT ST (SAFETY) ×2 IMPLANT
DRSG OPSITE POSTOP 4X10 (GAUZE/BANDAGES/DRESSINGS) ×2 IMPLANT
ELECT REM PT RETURN 9FT ADLT (ELECTROSURGICAL) ×2
ELECTRODE REM PT RTRN 9FT ADLT (ELECTROSURGICAL) ×1 IMPLANT
EXTRACTOR VACUUM M CUP 4 TUBE (SUCTIONS) IMPLANT
GLOVE BIOGEL PI IND STRL 7.0 (GLOVE) ×1 IMPLANT
GLOVE BIOGEL PI INDICATOR 7.0 (GLOVE) ×1
GLOVE ECLIPSE 7.0 STRL STRAW (GLOVE) ×4 IMPLANT
GOWN STRL REUS W/TWL LRG LVL3 (GOWN DISPOSABLE) ×4 IMPLANT
KIT ABG SYR 3ML LUER SLIP (SYRINGE) IMPLANT
NDL HYPO 25X5/8 SAFETYGLIDE (NEEDLE) IMPLANT
NEEDLE HYPO 25X5/8 SAFETYGLIDE (NEEDLE) IMPLANT
NS IRRIG 1000ML POUR BTL (IV SOLUTION) ×2 IMPLANT
PACK C SECTION WH (CUSTOM PROCEDURE TRAY) ×2 IMPLANT
PAD OB MATERNITY 4.3X12.25 (PERSONAL CARE ITEMS) ×2 IMPLANT
RETAINER VISCERAL (MISCELLANEOUS) ×1 IMPLANT
RETRACTOR TRAXI PANNICULUS (MISCELLANEOUS) IMPLANT
RTRCTR C-SECT PINK 25CM LRG (MISCELLANEOUS) ×1 IMPLANT
STRIP CLOSURE SKIN 1/2X4 (GAUZE/BANDAGES/DRESSINGS) ×1 IMPLANT
SUT MNCRL 0 VIOLET CTX 36 (SUTURE) ×3 IMPLANT
SUT MON AB 2-0 CT1 27 (SUTURE) ×4 IMPLANT
SUT MONOCRYL 0 CTX 36 (SUTURE) ×6
SUT PLAIN 2 0 XLH (SUTURE) ×1 IMPLANT
SUT VIC AB 4-0 KS 27 (SUTURE) ×1 IMPLANT
TOWEL OR 17X24 6PK STRL BLUE (TOWEL DISPOSABLE) ×2 IMPLANT
TRAXI PANNICULUS RETRACTOR (MISCELLANEOUS) ×1
TRAY FOLEY W/BAG SLVR 14FR LF (SET/KITS/TRAYS/PACK) IMPLANT
VACUUM CUP M-STYLE MYSTIC II (SUCTIONS) ×1 IMPLANT
WATER STERILE IRR 1000ML POUR (IV SOLUTION) ×2 IMPLANT

## 2020-01-16 NOTE — Lactation Note (Addendum)
This note was copied from a Nichole's chart. Lactation Consultation Note  Patient Name: Nichole Kirk [id number] Date: 01/16/2020  Nichole Kirk now 4 hours old born via csection LGA.Marland Kitchen  Infant in crib.  Mom repots she breastfed and formula fed all babies.  Mom reports her first were premature twins and she pumped for them.  One of them ended up latching and she breastfed him for 9 months.  Mom reports breastfed her third Nichole 9 months as well. Reviewed breastfeeding Consultation Services handouts. Mom started saying she was getting hot.  Turned temperature down for her.  Then she started vomiting.  Let RN Know.  Urged her to call lactation as needed    Maternal Data    Feeding Feeding Type: Bottle Fed - Formula Nipple Type: Slow - flow  LATCH Score                   Interventions    Lactation Tools Discussed/Used     Consult Status      Gary Gabrielsen Michaelle Copas 01/16/2020, 4:54 PM

## 2020-01-16 NOTE — Transfer of Care (Signed)
Immediate Anesthesia Transfer of Care Note  Patient: Nichole Kirk  Procedure(s) Performed: CESAREAN SECTION WITH BILATERAL TUBAL LIGATION (Bilateral )  Patient Location: PACU  Anesthesia Type:Spinal  Level of Consciousness: awake, alert  and oriented  Airway & Oxygen Therapy: Patient Spontanous Breathing  Post-op Assessment: Report given to RN and Post -op Vital signs reviewed and stable  Post vital signs: Reviewed and stable  Last Vitals:  Vitals Value Taken Time  BP 129/76 01/16/20 1310  Temp    Pulse 81 01/16/20 1315  Resp 21 01/16/20 1315  SpO2 98 % 01/16/20 1315  Vitals shown include unvalidated device data.  Last Pain:  Vitals:   01/16/20 1024  TempSrc: Oral         Complications: No apparent anesthesia complications

## 2020-01-16 NOTE — Anesthesia Postprocedure Evaluation (Signed)
Anesthesia Post Note  Patient: Nichole Kirk  Procedure(s) Performed: CESAREAN SECTION WITH BILATERAL TUBAL LIGATION (Bilateral )     Patient location during evaluation: PACU Anesthesia Type: Spinal Level of consciousness: oriented and awake and alert Pain management: pain level controlled Vital Signs Assessment: post-procedure vital signs reviewed and stable Respiratory status: spontaneous breathing, respiratory function stable and nonlabored ventilation Cardiovascular status: blood pressure returned to baseline and stable Postop Assessment: no headache, no backache, no apparent nausea or vomiting and spinal receding Anesthetic complications: no    Last Vitals:  Vitals:   01/16/20 1525 01/16/20 1526  BP:    Pulse: 73 73  Resp: 17 17  Temp:    SpO2: 99% 96%    Last Pain:  Vitals:   01/16/20 1415  TempSrc: Oral   Pain Goal:    LLE Motor Response: No movement to painful stimulus, No movement due to regional block (01/16/20 1500)   RLE Motor Response: No movement to painful stimulus, No movement due to regional block (01/16/20 1500)       Epidural/Spinal Function Cutaneous sensation: No Sensation (01/16/20 1500), Patient able to flex knees: No (01/16/20 1500), Patient able to lift hips off bed: No (01/16/20 1500), Back pain beyond tenderness at insertion site: No (01/16/20 1500), Progressively worsening motor and/or sensory loss: No (01/16/20 1500), Bowel and/or bladder incontinence post epidural: No (01/16/20 1500)  Kimball Manske A.

## 2020-01-16 NOTE — Anesthesia Preprocedure Evaluation (Signed)
Anesthesia Evaluation  Patient identified by MRN, date of birth, ID band Patient awake    Reviewed: Allergy & Precautions, NPO status , Patient's Chart, lab work & pertinent test results  Airway Mallampati: II  TM Distance: >3 FB Neck ROM: Full    Dental no notable dental hx. (+) Teeth Intact   Pulmonary asthma ,    Pulmonary exam normal breath sounds clear to auscultation       Cardiovascular negative cardio ROS Normal cardiovascular exam Rhythm:Regular Rate:Normal     Neuro/Psych Anxiety negative neurological ROS     GI/Hepatic Neg liver ROS, GERD  Medicated and Poorly Controlled,  Endo/Other  Obesity  Renal/GU negative Renal ROS  negative genitourinary   Musculoskeletal negative musculoskeletal ROS (+)   Abdominal (+) + obese,   Peds  Hematology  (+) anemia ,   Anesthesia Other Findings   Reproductive/Obstetrics (+) Pregnancy Previous C/Section x 2                             Anesthesia Physical Anesthesia Plan  ASA: II  Anesthesia Plan: Spinal   Post-op Pain Management:    Induction:   PONV Risk Score and Plan: 4 or greater and Scopolamine patch - Pre-op, Ondansetron and Treatment may vary due to age or medical condition  Airway Management Planned: Natural Airway  Additional Equipment:   Intra-op Plan:   Post-operative Plan:   Informed Consent: I have reviewed the patients History and Physical, chart, labs and discussed the procedure including the risks, benefits and alternatives for the proposed anesthesia with the patient or authorized representative who has indicated his/her understanding and acceptance.     Dental advisory given  Plan Discussed with: CRNA and Surgeon  Anesthesia Plan Comments:         Anesthesia Quick Evaluation

## 2020-01-16 NOTE — Op Note (Signed)
NAME: Nichole Kirk, Nichole Kirk MEDICAL RECORD XY:[id number] ACCOUNT [id number] DATE OF BIRTH:[birth year] FACILITY: [facility] LOCATION: [facility] PHYSICIAN:Sharniece Gibbon Orvilla Cornwall, MD  OPERATIVE REPORT  DATE OF PROCEDURE:  01/16/2020  PREOPERATIVE DIAGNOSES:   1.  Intrauterine pregnancy at term. 2.  History of prior cesarean section x2. 3.  The patient desires permanent sterilization.  POSTOPERATIVE DIAGNOSES: 1.  Intrauterine pregnancy at term. 2.  History of prior cesarean section x2. 3.  The patient desires permanent sterilization.  PROCEDURES:   1.  Repeat low transverse cesarean section. 2.  Bilateral tubal ligation with application of Filshie clips.  SURGEON:  Malva Limes, MD   ASSISTANT:  Salena Saner, FNP  ANESTHESIA:  Spinal.  ANTIBIOTICS:  Ancef 3 g.  DRAINS:  Foley, bedside drainage.  ESTIMATED BLOOD LOSS:  900 mL.  SPECIMENS:  None.  COMPLICATIONS:  None.  DESCRIPTION OF PROCEDURE:  The patient was taken to the operating room where a spinal anesthetic was administered without difficulty.  She was then prepped and draped in the usual fashion for this procedure.  A Foley catheter was placed.  A Pfannenstiel  incision was made through the previous scar.  On entering the abdominal cavity, there was a window noted in the uterus where the prior scar was.  The bladder flap was taken down with sharp dissection.  A low transverse uterine incision was made in the  midline using the Metzenbaum scissors.  Amniotic fluid was noted to be clear.  The infant was delivered in the vertex presentation.  On delivery of the head, the oropharynx and nostrils were bulb suctioned.  The remaining infant was then delivered.  The  cord was doubly clamped and cut and the infant handed to the waiting NICU team.  Cord blood was then obtained.  Placenta was manually removed.  The uterus was exteriorized.  The uterine cavity was wiped with a wet lap.  The uterine incision was closed in  a single layer  of 0 Monocryl suture in a running locking fashion.  The bladder flap was not repaired.  At this point, it was confirmed with the patient that she did want her tubal ligation performed.  A Filshie clip was placed in the isthmic portion of  the left fallopian tube.  The clip was placed perpendicular to the tube.  The entire tube was within the clasp.  The clasp was tightly closed.  A similar procedure was performed on the opposite side.  Following this, the parietal peritoneum and rectus  muscles were reapproximated in the midline using 2-0 Monocryl in a running fashion.  The fascia was closed using 0 Monocryl suture in a running fashion.  The subcuticular tissue was irrigated and then closed with 2-0 plain gut suture.  The skin incision  was closed using a 4-0 Vicryl in a subcuticular fashion.  Steri-Strips were then placed.  The patient was taken to the recovery room in stable condition.  Instrument and lap counts correct x3.  VN/NUANCE  D:01/16/2020 T:01/16/2020 JOB:[id number]

## 2020-01-16 NOTE — H&P (Signed)
Nichole Kirk is an 39 y.o. [phone] [redacted]w[redacted]d black female who presents for repeat c/s and BTL. Her preg was complicated by AMA and asthma. GBS- neg/ OGTT-wnl/NIPT neg  Past Medical History:  Diagnosis Date  . Abnormal Pap smear   . Anemia   . Anxiety 2012   no meds   . Asthma    albuterol inhaler  . Gonorrhea 2007  . Ovarian cyst   . Urinary tract infection     Past Surgical History:  Procedure Laterality Date  . CESAREAN SECTION  2010  . CESAREAN SECTION  07/29/2011   Procedure: CESAREAN SECTION;  Surgeon: Marcial Pacas;  Location: [facility];  Service: Gynecology;  Laterality: N/A;  Repeat  . CHOLECYSTECTOMY    . DILATION AND CURETTAGE OF UTERUS    . DILATION AND EVACUATION N/A 11/22/2017   Procedure: DILATATION AND EVACUATION;  Surgeon: Olga Millers, MD;  Location: [facility];  Service: Gynecology;  Laterality: N/A;  . DILATION AND EVACUATION N/A 11/08/2018   Procedure: DILATATION AND EVACUATION;  Surgeon: Osborne Oman, MD;  Location: [facility];  Service: Gynecology;  Laterality: N/A;  . LEEP    . mab     x 1 - no surgery required 2009  . WISDOM TOOTH EXTRACTION      Family History  Problem Relation Age of Onset  . Cancer Mother        Breast  . Breast cancer Mother   . Diabetes Maternal Grandfather   . Lupus Maternal Grandmother   . Breast cancer Maternal Grandmother   . Cancer Father   . Multiple sclerosis Sister   . Autism Son   . Cancer Paternal Aunt        breast  . Cancer Paternal Aunt        breast  . Cancer Paternal Aunt        ovarian   Social History:  reports that she has never smoked. She has never used smokeless tobacco. She reports that she does not drink alcohol or use drugs.  Allergies:  Allergies  Allergen Reactions  . Aspirin Anaphylaxis    Pt says she can take ibuprofen  . Mushroom Extract Complex Swelling and Rash    Facility-Administered Medications Prior to Admission  Medication Dose Route Frequency Provider  Last Rate Last Admin  . betamethasone acetate-betamethasone sodium phosphate (CELESTONE) injection 12 mg  12 mg Intramuscular Once Jerelyn Charles, MD       Medications Prior to Admission  Medication Sig Dispense Refill  . acetaminophen (TYLENOL) 325 MG tablet Take 650 mg by mouth every 6 (six) hours as needed.    Marland Kitchen albuterol (PROVENTIL HFA;VENTOLIN HFA) 108 (90 Base) MCG/ACT inhaler Inhale 1-2 puffs into the lungs every 6 (six) hours as needed for wheezing or shortness of breath.    . budesonide-formoterol (SYMBICORT) 160-4.5 MCG/ACT inhaler Inhale 2 puffs into the lungs 2 (two) times daily. 1 Inhaler 11  . esomeprazole (NEXIUM) 20 MG capsule Take 1 capsule (20 mg total) by mouth daily. 14 capsule 0  . Prenatal Vit-Fe Fumarate-FA (PRENATAL MULTIVITAMIN) TABS tablet Take 1 tablet by mouth daily at 12 noon.         Blood pressure 129/89, pulse 92, temperature 98.7 F (37.1 C), temperature source Oral, resp. rate 18, height 5\' 11"  (1.803 m), weight 118.4 kg, SpO2 100 %, unknown if currently breastfeeding. General appearance: alert and cooperative Abdomen: gravid, non tender   Lab Results  Component Value Date  WBC 8.7 01/14/2020   HGB 11.3 (L) 01/14/2020   HCT 36.9 01/14/2020   MCV 84.4 01/14/2020   PLT 228 01/14/2020   Lab Results  Component Value Date   PREGTESTUR POSITIVE (A) 09/30/2017      Patient Active Problem List   Diagnosis Date Noted  . Pelvic pain affecting pregnancy in third trimester, antepartum 12/25/2019  . Dizziness 12/24/2019  . 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 HYPERLIPIDEMIA 06/17/2010  . ASTHMA 06/17/2010   IMP/ IUP at term          H.O c/s x 2          Desires BTL         AMA Plan/ proceed with C/S  Lyndsay Talamante E 01/16/2020, 10:57 AM

## 2020-01-17 LAB — CBC
HCT: 32.6 % — ABNORMAL LOW (ref 36.0–46.0)
Hemoglobin: 9.9 g/dL — ABNORMAL LOW (ref 12.0–15.0)
MCH: 25.4 pg — ABNORMAL LOW (ref 26.0–34.0)
MCHC: 30.4 g/dL (ref 30.0–36.0)
MCV: 83.6 fL (ref 80.0–100.0)
Platelets: 220 10*3/uL (ref 150–400)
RBC: 3.9 MIL/uL (ref 3.87–5.11)
RDW: 19.1 % — ABNORMAL HIGH (ref 11.5–15.5)
WBC: 11.8 10*3/uL — ABNORMAL HIGH (ref 4.0–10.5)
nRBC: 0 % (ref 0.0–0.2)

## 2020-01-17 LAB — BIRTH TISSUE RECOVERY COLLECTION (PLACENTA DONATION)

## 2020-01-17 NOTE — Progress Notes (Signed)
Subjective: Postpartum Day 1: Cesarean Delivery Patient reports: pain controlled with PO meds, tolerating PO, has not voided yet since cath removed this AM.     Objective: Vitals:   01/16/20 1657 01/16/20 2101 01/17/20 0100 01/17/20 0505  BP: 95/61 (!) 93/54  101/61  Pulse: 72 67  69  Resp:  18 18 20   Temp: 98.1 F (36.7 C) 98.6 F (37 C)  (!) 97.1 F (36.2 C)  TempSrc: Oral Oral  Oral  SpO2: 98% 97% 97% 98%  Weight:      Height:        Physical Exam:  General: alert and no distress Lochia: appropriate Uterine Fundus: firm Incision: dressing clean and dry DVT Evaluation: No evidence of DVT seen on physical exam.  Recent Labs    01/14/20 0937 01/17/20 0643  HGB 11.3* 9.9*  HCT 36.9 32.6*    Assessment/Plan: Status post Cesarean section. Doing well postoperatively.  Continue current care. Nichole Kirk 39 y.o. 20 PPD#1 sp rCS and BTL 1. POC: EBL 900cc, Hgb11.3 to 9.9 2. BMI 36 3. Asthma 4. Desires neonatal circumcision, R/B/A of procedure discussed at length. Pt understands that neonatal circumcision is not considered medically necessary and is elective. The risks include, but are not limited to bleeding, infection, damage to the penis, development of scar tissue, and having to have it redone at a later date. Pt understands theses risks and wishes to proceed 5. Rubella immune blood type A+, bottle feeding, baby boy  Nichole Kirk 01/17/2020, 8:18 AM

## 2020-01-17 NOTE — Lactation Note (Signed)
This note was copied from a baby's chart. Lactation Consultation Note  Patient Name: Nichole Kirk [id number] Date: 01/17/2020 Reason for consult: Follow-up assessment Baby is 25 hours old/4% weight loss.  Mom is putting baby to breast with cues and then supplementing with formula.  Mom states she does not see any colostrum with hand expression.  Stressed importance of always latching prior to formula.  Reassured that baby can allow milk letdown easier.  I offered latch assist but mom states she does not need help.  Encouraged to call with concerns prn.  Maternal Data    Feeding Feeding Type: Bottle Fed - Formula Nipple Type: Slow - flow  LATCH Score                   Interventions    Lactation Tools Discussed/Used     Consult Status Consult Status: Follow-up Date: 01/18/20 Follow-up type: In-patient    Huston Foley 01/17/2020, 1:30 PM

## 2020-01-17 NOTE — Progress Notes (Signed)
MOB was referred for history of depression/anxiety. * Referral screened out by Clinical Social Worker because none of the following criteria appear to apply: ~ History of anxiety/depression during this pregnancy, or of post-partum depression following prior delivery. ~ Diagnosis of anxiety and/or depression within last 3 years.Per further chart review, MOB diagnosed with anxiety in 2012.  OR * MOB's symptoms currently being treated with medication and/or therapy.   CSW aware that MOB scored 3 on Edinburgh with no concerns to CSW.      Bralen Wiltgen S. Lavonne Cass, MSW, LCSW [facility] [phone]   

## 2020-01-18 MED ORDER — SENNOSIDES-DOCUSATE SODIUM 8.6-50 MG PO TABS: 2.0000 | ORAL_TABLET | ORAL | 0 refills | Status: AC

## 2020-01-18 MED ORDER — IBUPROFEN 600 MG PO TABS: 600.0000 mg | ORAL_TABLET | Freq: Four times a day (QID) | ORAL | 0 refills | Status: AC | PRN

## 2020-01-18 MED ORDER — BISACODYL 10 MG RE SUPP
10.0000 mg | Freq: Once | RECTAL | Status: DC
  Filled 2020-01-18: qty 1

## 2020-01-18 NOTE — Discharge Summary (Signed)
Obstetric Discharge Summary Reason for Admission: cesarean section Prenatal Procedures: NST and ultrasound Intrapartum Procedures: cesarean: low cervical, transverse and tubal ligation Postpartum Procedures: none Complications-Operative and Postpartum: none Hemoglobin  Date Value Ref Range Status  01/17/2020 9.9 (L) 12.0 - 15.0 g/dL Final   HCT  Date Value Ref Range Status  01/17/2020 32.6 (L) 36.0 - 46.0 % Final    Physical Exam:  General: alert Lochia: appropriate Uterine Fundus: firm  Discharge Diagnoses: Term preg with history of prior c/s x 2, Desires repeat c/s and BTL, Asthma history  Discharge Information: Date: 01/18/2020 Activity: pelvic rest Diet: routine Medications: PNV and Ibuprofen Condition: stable Instructions: refer to practice specific booklet Discharge to: home Follow-up Information    Levi Aland, MD. Schedule an appointment as soon as possible for a visit in 1 month(s).   Specialty: Obstetrics and Gynecology Contact information: [address] [place] [address] [phone]           Newborn Data: Live born female  Birth Weight: 9 lb 1.5 oz (4125 g) APGAR: 9, 9  Newborn Delivery   Birth date/time: 01/16/2019 12:22:00 Delivery type: C-Section, Vacuum Assisted Trial of labor: No C-section categorization: Repeat      Home with mother.  Hetty Linhart E 01/18/2020, 11:48 AM

## 2020-01-18 NOTE — Progress Notes (Signed)
POD#2 Pt states that she is feeling good. She has not had BM or flatus yet. She stopped percocet to help with this. She is tolerating diet well .  VSSAF PE- abdomen - soft, non tender, slightly distended. +BSs. IMP/Slow GI function Plan/Will use dulcolax supp this am

## 2020-01-18 NOTE — Lactation Note (Signed)
This note was copied from a baby's chart. Lactation Consultation Note  Patient Name: Boy Tyeesha Riker [id number] Date: 01/18/2020 Reason for consult: Follow-up assessment  P4 mother whose infant is now 10 hours old.  Mother has breast fed/bottle fed her other children.    Mother has been primarily bottle feeding in the hospital.  She stated that she "doesn't have any colostrum."  Her son will latch for about a minute and gets "frustrated."  Mother gives him a bottle right after he becomes frustrated.  Educated mother on some breastfeeding basics, reassuring her that she does have colostrum.  She needs to actively latch and feed baby or pump in order to stimulate her breasts for a good milk supply.  Reminded mother how easy it can be for a baby to take an artificial nipple but that he initially has to work to learn how to breast feed.  Suggested she continue working with him and always offer the breast prior to any supplementation.  Provided education on appropriate amounts of formula supplementation to give if he does not breast feed.  Mother verbalized understanding.  Engorgement prevention/treatment reviewed.  Stressed the importance of beginning to pump every three hours after baby attempts to latch to stimulate her breasts for milk production.  Manual pump with instructions provided.  Changed the #24 flange to a #27 flange for greater fit and comfort.  Also gave mother a #30 flange to take home due to the possibility  of nipple enlargement when her milk transitions.  Mother has a DEBP for home use.  Father present.  Mother hopes to be discharged today.   Maternal Data    Feeding Feeding Type: Bottle Fed - Breast Milk Nipple Type: Slow - flow  LATCH Score                   Interventions    Lactation Tools Discussed/Used     Consult Status Consult Status: Complete Date: 01/18/20 Follow-up type: Call as needed    Ivanell Deshotel R Tiffanye Hartmann 01/18/2020, 8:50 AM

## 2020-01-18 NOTE — Progress Notes (Signed)
Pt had a nl BM . Will discharge.

## 2020-02-29 ENCOUNTER — Other Ambulatory Visit: Payer: Medicaid Other

## 2020-03-25 ENCOUNTER — Ambulatory Visit
Admission: RE | Admit: 2020-03-25 | Discharge: 2020-03-25 | Disposition: A | Discharge status: Home / Self Care | Payer: Medicaid Other | Source: Ambulatory Visit | Attending: Physician Assistant | Admitting: Physician Assistant

## 2020-03-25 ENCOUNTER — Other Ambulatory Visit: Payer: Self-pay

## 2020-03-25 ENCOUNTER — Other Ambulatory Visit: Payer: Self-pay | Admitting: Physician Assistant

## 2020-03-25 DIAGNOSIS — N6310 Unspecified lump in the right breast, unspecified quadrant: Secondary | ICD-10-CM

## 2020-04-02 ENCOUNTER — Inpatient Hospital Stay: Admission: RE | Admit: 2020-04-02 | Payer: Medicaid Other | Source: Ambulatory Visit

## 2020-04-12 ENCOUNTER — Other Ambulatory Visit: Payer: Self-pay

## 2020-04-12 ENCOUNTER — Ambulatory Visit
Admission: RE | Admit: 2020-04-12 | Discharge: 2020-04-12 | Disposition: A | Discharge status: Home / Self Care | Payer: Medicaid Other | Source: Ambulatory Visit | Attending: Physician Assistant | Admitting: Physician Assistant

## 2020-04-12 DIAGNOSIS — N6310 Unspecified lump in the right breast, unspecified quadrant: Secondary | ICD-10-CM

## 2020-04-12 HISTORY — PX: BREAST BIOPSY: SHX20

## 2020-08-08 ENCOUNTER — Other Ambulatory Visit: Payer: Self-pay | Admitting: Physician Assistant

## 2020-08-08 DIAGNOSIS — R591 Generalized enlarged lymph nodes: Secondary | ICD-10-CM

## 2020-09-02 ENCOUNTER — Other Ambulatory Visit: Payer: Self-pay | Admitting: Physician Assistant

## 2020-09-03 ENCOUNTER — Other Ambulatory Visit: Payer: Self-pay | Admitting: Physician Assistant

## 2020-09-05 ENCOUNTER — Ambulatory Visit
Admission: RE | Admit: 2020-09-05 | Discharge: 2020-09-05 | Disposition: A | Discharge status: Home / Self Care | Payer: Medicaid Other | Source: Ambulatory Visit | Attending: Physician Assistant | Admitting: Physician Assistant

## 2020-09-05 ENCOUNTER — Other Ambulatory Visit: Payer: Self-pay

## 2020-09-05 DIAGNOSIS — R591 Generalized enlarged lymph nodes: Secondary | ICD-10-CM

## 2020-09-23 ENCOUNTER — Other Ambulatory Visit: Payer: Self-pay | Admitting: Internal Medicine

## 2020-09-23 DIAGNOSIS — N63 Unspecified lump in unspecified breast: Secondary | ICD-10-CM

## 2021-03-14 ENCOUNTER — Other Ambulatory Visit: Payer: Self-pay | Admitting: Physician Assistant

## 2021-03-14 ENCOUNTER — Other Ambulatory Visit: Payer: Medicaid Other

## 2021-03-14 DIAGNOSIS — N63 Unspecified lump in unspecified breast: Secondary | ICD-10-CM

## 2021-03-17 ENCOUNTER — Other Ambulatory Visit: Payer: Medicaid Other

## 2021-06-06 ENCOUNTER — Ambulatory Visit: Payer: Medicaid Other

## 2021-06-06 ENCOUNTER — Ambulatory Visit
Admission: RE | Admit: 2021-06-06 | Discharge: 2021-06-06 | Disposition: A | Discharge status: Home / Self Care | Payer: Medicaid Other | Source: Ambulatory Visit | Attending: Physician Assistant | Admitting: Physician Assistant

## 2021-06-06 DIAGNOSIS — N63 Unspecified lump in unspecified breast: Secondary | ICD-10-CM

## 2021-06-21 ENCOUNTER — Other Ambulatory Visit: Payer: Self-pay

## 2021-06-21 ENCOUNTER — Ambulatory Visit: Payer: Medicaid Other

## 2021-06-21 ENCOUNTER — Ambulatory Visit: Admission: RE | Admit: 2021-06-21 | Payer: Medicaid Other | Source: Ambulatory Visit

## 2021-07-30 ENCOUNTER — Ambulatory Visit
Admission: RE | Admit: 2021-07-30 | Discharge: 2021-07-30 | Disposition: A | Discharge status: Home / Self Care | Payer: Medicaid Other | Source: Ambulatory Visit | Attending: Physician Assistant | Admitting: Physician Assistant

## 2021-07-30 ENCOUNTER — Other Ambulatory Visit: Payer: Self-pay

## 2021-07-30 DIAGNOSIS — N63 Unspecified lump in unspecified breast: Secondary | ICD-10-CM
